# Patient Record
Sex: Male | Born: 1945
Health system: Southern US, Community
[De-identification: ages and names within clinical notes are randomized; demographics above are authoritative.]

## PROBLEM LIST (undated history)

## (undated) DIAGNOSIS — N401 Enlarged prostate with lower urinary tract symptoms: Secondary | ICD-10-CM

## (undated) DIAGNOSIS — F329 Major depressive disorder, single episode, unspecified: Secondary | ICD-10-CM

## (undated) DIAGNOSIS — E7800 Pure hypercholesterolemia, unspecified: Secondary | ICD-10-CM

## (undated) DIAGNOSIS — E1169 Type 2 diabetes mellitus with other specified complication: Secondary | ICD-10-CM

## (undated) HISTORY — DX: Type 2 diabetes mellitus with other specified complication: E78.00

## (undated) HISTORY — PX: CHOLECYSTECTOMY: SHX55

## (undated) HISTORY — DX: Type 2 diabetes mellitus with other specified complication: E11.69

## (undated) HISTORY — DX: Major depressive disorder, single episode, unspecified: F32.9

## (undated) HISTORY — DX: Benign prostatic hyperplasia with lower urinary tract symptoms: N40.1

---

## 1979-07-04 HISTORY — PX: POLYPECTOMY: SHX149

## 2014-10-07 DIAGNOSIS — Z125 Encounter for screening for malignant neoplasm of prostate: Secondary | ICD-10-CM | POA: Diagnosis not present

## 2014-10-07 DIAGNOSIS — Z79899 Other long term (current) drug therapy: Secondary | ICD-10-CM | POA: Diagnosis not present

## 2014-10-07 DIAGNOSIS — F329 Major depressive disorder, single episode, unspecified: Secondary | ICD-10-CM | POA: Diagnosis not present

## 2014-10-07 DIAGNOSIS — N401 Enlarged prostate with lower urinary tract symptoms: Secondary | ICD-10-CM | POA: Diagnosis not present

## 2014-10-07 DIAGNOSIS — R030 Elevated blood-pressure reading, without diagnosis of hypertension: Secondary | ICD-10-CM | POA: Diagnosis not present

## 2014-10-07 DIAGNOSIS — B353 Tinea pedis: Secondary | ICD-10-CM | POA: Diagnosis not present

## 2014-11-11 DIAGNOSIS — H269 Unspecified cataract: Secondary | ICD-10-CM | POA: Diagnosis not present

## 2014-11-11 DIAGNOSIS — N401 Enlarged prostate with lower urinary tract symptoms: Secondary | ICD-10-CM | POA: Diagnosis not present

## 2014-11-11 DIAGNOSIS — F4320 Adjustment disorder, unspecified: Secondary | ICD-10-CM | POA: Diagnosis not present

## 2014-11-11 DIAGNOSIS — E780 Pure hypercholesterolemia: Secondary | ICD-10-CM | POA: Diagnosis not present

## 2015-01-21 DIAGNOSIS — H43393 Other vitreous opacities, bilateral: Secondary | ICD-10-CM | POA: Diagnosis not present

## 2015-01-21 DIAGNOSIS — H2513 Age-related nuclear cataract, bilateral: Secondary | ICD-10-CM | POA: Diagnosis not present

## 2015-09-02 DIAGNOSIS — B353 Tinea pedis: Secondary | ICD-10-CM | POA: Diagnosis not present

## 2015-09-02 DIAGNOSIS — E785 Hyperlipidemia, unspecified: Secondary | ICD-10-CM | POA: Diagnosis not present

## 2015-09-02 DIAGNOSIS — N401 Enlarged prostate with lower urinary tract symptoms: Secondary | ICD-10-CM | POA: Diagnosis not present

## 2015-09-02 DIAGNOSIS — R7301 Impaired fasting glucose: Secondary | ICD-10-CM | POA: Diagnosis not present

## 2015-09-02 DIAGNOSIS — Z0000 Encounter for general adult medical examination without abnormal findings: Secondary | ICD-10-CM | POA: Diagnosis not present

## 2015-09-02 DIAGNOSIS — Z79899 Other long term (current) drug therapy: Secondary | ICD-10-CM | POA: Diagnosis not present

## 2015-09-02 DIAGNOSIS — F329 Major depressive disorder, single episode, unspecified: Secondary | ICD-10-CM | POA: Diagnosis not present

## 2015-12-30 DIAGNOSIS — M2011 Hallux valgus (acquired), right foot: Secondary | ICD-10-CM | POA: Diagnosis not present

## 2015-12-30 DIAGNOSIS — B353 Tinea pedis: Secondary | ICD-10-CM | POA: Diagnosis not present

## 2015-12-30 DIAGNOSIS — M21611 Bunion of right foot: Secondary | ICD-10-CM | POA: Diagnosis not present

## 2016-01-10 DIAGNOSIS — M2011 Hallux valgus (acquired), right foot: Secondary | ICD-10-CM | POA: Diagnosis not present

## 2016-09-04 DIAGNOSIS — E785 Hyperlipidemia, unspecified: Secondary | ICD-10-CM | POA: Diagnosis not present

## 2016-09-04 DIAGNOSIS — Z79899 Other long term (current) drug therapy: Secondary | ICD-10-CM | POA: Diagnosis not present

## 2016-09-04 DIAGNOSIS — Z125 Encounter for screening for malignant neoplasm of prostate: Secondary | ICD-10-CM | POA: Diagnosis not present

## 2016-09-04 DIAGNOSIS — Z131 Encounter for screening for diabetes mellitus: Secondary | ICD-10-CM | POA: Diagnosis not present

## 2016-09-14 DIAGNOSIS — Z0000 Encounter for general adult medical examination without abnormal findings: Secondary | ICD-10-CM | POA: Diagnosis not present

## 2016-09-14 DIAGNOSIS — Z9181 History of falling: Secondary | ICD-10-CM | POA: Diagnosis not present

## 2016-09-14 DIAGNOSIS — E785 Hyperlipidemia, unspecified: Secondary | ICD-10-CM | POA: Diagnosis not present

## 2016-09-14 DIAGNOSIS — Z1389 Encounter for screening for other disorder: Secondary | ICD-10-CM | POA: Diagnosis not present

## 2016-09-14 DIAGNOSIS — F329 Major depressive disorder, single episode, unspecified: Secondary | ICD-10-CM | POA: Diagnosis not present

## 2016-09-14 DIAGNOSIS — E119 Type 2 diabetes mellitus without complications: Secondary | ICD-10-CM | POA: Diagnosis not present

## 2016-09-14 DIAGNOSIS — Z6832 Body mass index (BMI) 32.0-32.9, adult: Secondary | ICD-10-CM | POA: Diagnosis not present

## 2016-09-14 DIAGNOSIS — N401 Enlarged prostate with lower urinary tract symptoms: Secondary | ICD-10-CM | POA: Diagnosis not present

## 2017-01-01 DIAGNOSIS — H269 Unspecified cataract: Secondary | ICD-10-CM | POA: Diagnosis not present

## 2017-01-01 DIAGNOSIS — Z6832 Body mass index (BMI) 32.0-32.9, adult: Secondary | ICD-10-CM | POA: Diagnosis not present

## 2017-01-01 DIAGNOSIS — E119 Type 2 diabetes mellitus without complications: Secondary | ICD-10-CM | POA: Diagnosis not present

## 2017-01-01 DIAGNOSIS — B353 Tinea pedis: Secondary | ICD-10-CM | POA: Diagnosis not present

## 2017-02-19 DIAGNOSIS — D1801 Hemangioma of skin and subcutaneous tissue: Secondary | ICD-10-CM | POA: Diagnosis not present

## 2017-02-19 DIAGNOSIS — L820 Inflamed seborrheic keratosis: Secondary | ICD-10-CM | POA: Diagnosis not present

## 2017-02-27 DIAGNOSIS — H25813 Combined forms of age-related cataract, bilateral: Secondary | ICD-10-CM | POA: Diagnosis not present

## 2017-03-22 DIAGNOSIS — H43813 Vitreous degeneration, bilateral: Secondary | ICD-10-CM | POA: Diagnosis not present

## 2017-03-22 DIAGNOSIS — E113293 Type 2 diabetes mellitus with mild nonproliferative diabetic retinopathy without macular edema, bilateral: Secondary | ICD-10-CM | POA: Diagnosis not present

## 2017-04-16 DIAGNOSIS — Z01818 Encounter for other preprocedural examination: Secondary | ICD-10-CM | POA: Diagnosis not present

## 2017-04-16 DIAGNOSIS — H25811 Combined forms of age-related cataract, right eye: Secondary | ICD-10-CM | POA: Diagnosis not present

## 2017-04-16 DIAGNOSIS — H2511 Age-related nuclear cataract, right eye: Secondary | ICD-10-CM | POA: Diagnosis not present

## 2017-04-16 DIAGNOSIS — E119 Type 2 diabetes mellitus without complications: Secondary | ICD-10-CM | POA: Diagnosis not present

## 2017-04-25 DIAGNOSIS — H25812 Combined forms of age-related cataract, left eye: Secondary | ICD-10-CM | POA: Diagnosis not present

## 2017-04-25 DIAGNOSIS — H2512 Age-related nuclear cataract, left eye: Secondary | ICD-10-CM | POA: Diagnosis not present

## 2017-09-17 DIAGNOSIS — Z6833 Body mass index (BMI) 33.0-33.9, adult: Secondary | ICD-10-CM | POA: Diagnosis not present

## 2017-09-17 DIAGNOSIS — Z0000 Encounter for general adult medical examination without abnormal findings: Secondary | ICD-10-CM | POA: Diagnosis not present

## 2017-09-17 DIAGNOSIS — N401 Enlarged prostate with lower urinary tract symptoms: Secondary | ICD-10-CM | POA: Diagnosis not present

## 2017-09-17 DIAGNOSIS — E119 Type 2 diabetes mellitus without complications: Secondary | ICD-10-CM | POA: Diagnosis not present

## 2017-09-17 DIAGNOSIS — E785 Hyperlipidemia, unspecified: Secondary | ICD-10-CM | POA: Diagnosis not present

## 2017-09-17 DIAGNOSIS — Z79899 Other long term (current) drug therapy: Secondary | ICD-10-CM | POA: Diagnosis not present

## 2017-10-22 DIAGNOSIS — Z961 Presence of intraocular lens: Secondary | ICD-10-CM | POA: Diagnosis not present

## 2017-10-22 DIAGNOSIS — H43813 Vitreous degeneration, bilateral: Secondary | ICD-10-CM | POA: Diagnosis not present

## 2018-12-18 DIAGNOSIS — E785 Hyperlipidemia, unspecified: Secondary | ICD-10-CM | POA: Diagnosis not present

## 2018-12-18 DIAGNOSIS — Z79899 Other long term (current) drug therapy: Secondary | ICD-10-CM | POA: Diagnosis not present

## 2018-12-18 DIAGNOSIS — Z6833 Body mass index (BMI) 33.0-33.9, adult: Secondary | ICD-10-CM | POA: Diagnosis not present

## 2018-12-18 DIAGNOSIS — E7800 Pure hypercholesterolemia, unspecified: Secondary | ICD-10-CM | POA: Diagnosis not present

## 2018-12-18 DIAGNOSIS — N401 Enlarged prostate with lower urinary tract symptoms: Secondary | ICD-10-CM | POA: Diagnosis not present

## 2018-12-18 DIAGNOSIS — Z0000 Encounter for general adult medical examination without abnormal findings: Secondary | ICD-10-CM | POA: Diagnosis not present

## 2018-12-18 DIAGNOSIS — E119 Type 2 diabetes mellitus without complications: Secondary | ICD-10-CM | POA: Diagnosis not present

## 2019-01-06 DIAGNOSIS — Z6833 Body mass index (BMI) 33.0-33.9, adult: Secondary | ICD-10-CM | POA: Diagnosis not present

## 2019-01-06 DIAGNOSIS — E1165 Type 2 diabetes mellitus with hyperglycemia: Secondary | ICD-10-CM | POA: Diagnosis not present

## 2019-03-19 DIAGNOSIS — E1165 Type 2 diabetes mellitus with hyperglycemia: Secondary | ICD-10-CM | POA: Diagnosis not present

## 2019-03-20 DIAGNOSIS — E7800 Pure hypercholesterolemia, unspecified: Secondary | ICD-10-CM | POA: Diagnosis not present

## 2019-03-20 DIAGNOSIS — E1165 Type 2 diabetes mellitus with hyperglycemia: Secondary | ICD-10-CM | POA: Diagnosis not present

## 2019-03-20 DIAGNOSIS — Z6833 Body mass index (BMI) 33.0-33.9, adult: Secondary | ICD-10-CM | POA: Diagnosis not present

## 2019-07-07 DIAGNOSIS — H524 Presbyopia: Secondary | ICD-10-CM | POA: Diagnosis not present

## 2019-07-07 DIAGNOSIS — H52223 Regular astigmatism, bilateral: Secondary | ICD-10-CM | POA: Diagnosis not present

## 2019-09-11 DIAGNOSIS — E1165 Type 2 diabetes mellitus with hyperglycemia: Secondary | ICD-10-CM | POA: Diagnosis not present

## 2019-09-11 DIAGNOSIS — E7800 Pure hypercholesterolemia, unspecified: Secondary | ICD-10-CM | POA: Diagnosis not present

## 2019-09-17 DIAGNOSIS — E7800 Pure hypercholesterolemia, unspecified: Secondary | ICD-10-CM | POA: Diagnosis not present

## 2019-09-17 DIAGNOSIS — Z6830 Body mass index (BMI) 30.0-30.9, adult: Secondary | ICD-10-CM | POA: Diagnosis not present

## 2019-09-17 DIAGNOSIS — N401 Enlarged prostate with lower urinary tract symptoms: Secondary | ICD-10-CM | POA: Diagnosis not present

## 2019-09-17 DIAGNOSIS — Z9181 History of falling: Secondary | ICD-10-CM | POA: Diagnosis not present

## 2019-09-17 DIAGNOSIS — E1165 Type 2 diabetes mellitus with hyperglycemia: Secondary | ICD-10-CM | POA: Diagnosis not present

## 2019-12-10 DIAGNOSIS — Z961 Presence of intraocular lens: Secondary | ICD-10-CM | POA: Diagnosis not present

## 2019-12-10 DIAGNOSIS — H43393 Other vitreous opacities, bilateral: Secondary | ICD-10-CM | POA: Diagnosis not present

## 2019-12-10 DIAGNOSIS — H26493 Other secondary cataract, bilateral: Secondary | ICD-10-CM | POA: Diagnosis not present

## 2020-01-09 DIAGNOSIS — H26491 Other secondary cataract, right eye: Secondary | ICD-10-CM | POA: Diagnosis not present

## 2020-03-23 DIAGNOSIS — E1165 Type 2 diabetes mellitus with hyperglycemia: Secondary | ICD-10-CM | POA: Diagnosis not present

## 2020-03-24 DIAGNOSIS — N401 Enlarged prostate with lower urinary tract symptoms: Secondary | ICD-10-CM | POA: Diagnosis not present

## 2020-03-24 DIAGNOSIS — E1165 Type 2 diabetes mellitus with hyperglycemia: Secondary | ICD-10-CM | POA: Diagnosis not present

## 2020-03-24 DIAGNOSIS — E7800 Pure hypercholesterolemia, unspecified: Secondary | ICD-10-CM | POA: Diagnosis not present

## 2020-03-24 DIAGNOSIS — Z0000 Encounter for general adult medical examination without abnormal findings: Secondary | ICD-10-CM | POA: Diagnosis not present

## 2020-03-24 DIAGNOSIS — Z0183 Encounter for blood typing: Secondary | ICD-10-CM | POA: Diagnosis not present

## 2020-03-24 DIAGNOSIS — Z6830 Body mass index (BMI) 30.0-30.9, adult: Secondary | ICD-10-CM | POA: Diagnosis not present

## 2020-08-02 DIAGNOSIS — J329 Chronic sinusitis, unspecified: Secondary | ICD-10-CM | POA: Diagnosis not present

## 2020-08-02 DIAGNOSIS — J40 Bronchitis, not specified as acute or chronic: Secondary | ICD-10-CM | POA: Diagnosis not present

## 2020-08-02 DIAGNOSIS — J302 Other seasonal allergic rhinitis: Secondary | ICD-10-CM | POA: Diagnosis not present

## 2020-11-25 DIAGNOSIS — E1169 Type 2 diabetes mellitus with other specified complication: Secondary | ICD-10-CM | POA: Diagnosis not present

## 2020-11-25 DIAGNOSIS — E7800 Pure hypercholesterolemia, unspecified: Secondary | ICD-10-CM | POA: Diagnosis not present

## 2020-11-26 DIAGNOSIS — E1169 Type 2 diabetes mellitus with other specified complication: Secondary | ICD-10-CM | POA: Diagnosis not present

## 2020-11-26 DIAGNOSIS — N401 Enlarged prostate with lower urinary tract symptoms: Secondary | ICD-10-CM | POA: Diagnosis not present

## 2020-11-26 DIAGNOSIS — E7800 Pure hypercholesterolemia, unspecified: Secondary | ICD-10-CM | POA: Diagnosis not present

## 2020-11-26 DIAGNOSIS — Z6829 Body mass index (BMI) 29.0-29.9, adult: Secondary | ICD-10-CM | POA: Diagnosis not present

## 2020-11-26 DIAGNOSIS — M21611 Bunion of right foot: Secondary | ICD-10-CM | POA: Diagnosis not present

## 2021-01-20 DIAGNOSIS — E1169 Type 2 diabetes mellitus with other specified complication: Secondary | ICD-10-CM | POA: Diagnosis not present

## 2021-01-20 DIAGNOSIS — E7800 Pure hypercholesterolemia, unspecified: Secondary | ICD-10-CM | POA: Diagnosis not present

## 2021-01-20 DIAGNOSIS — Z79899 Other long term (current) drug therapy: Secondary | ICD-10-CM | POA: Diagnosis not present

## 2021-01-21 DIAGNOSIS — E7800 Pure hypercholesterolemia, unspecified: Secondary | ICD-10-CM | POA: Diagnosis not present

## 2021-01-21 DIAGNOSIS — R17 Unspecified jaundice: Secondary | ICD-10-CM | POA: Diagnosis not present

## 2021-01-21 DIAGNOSIS — R1013 Epigastric pain: Secondary | ICD-10-CM | POA: Diagnosis not present

## 2021-01-21 DIAGNOSIS — K8070 Calculus of gallbladder and bile duct without cholecystitis without obstruction: Secondary | ICD-10-CM | POA: Diagnosis not present

## 2021-01-21 DIAGNOSIS — E1169 Type 2 diabetes mellitus with other specified complication: Secondary | ICD-10-CM | POA: Diagnosis not present

## 2021-01-21 DIAGNOSIS — Z6828 Body mass index (BMI) 28.0-28.9, adult: Secondary | ICD-10-CM | POA: Diagnosis not present

## 2021-01-21 DIAGNOSIS — K831 Obstruction of bile duct: Secondary | ICD-10-CM | POA: Diagnosis not present

## 2021-01-21 DIAGNOSIS — K769 Liver disease, unspecified: Secondary | ICD-10-CM | POA: Diagnosis not present

## 2021-01-21 DIAGNOSIS — K838 Other specified diseases of biliary tract: Secondary | ICD-10-CM | POA: Diagnosis not present

## 2021-01-21 DIAGNOSIS — K5730 Diverticulosis of large intestine without perforation or abscess without bleeding: Secondary | ICD-10-CM | POA: Diagnosis not present

## 2021-01-21 DIAGNOSIS — K828 Other specified diseases of gallbladder: Secondary | ICD-10-CM | POA: Diagnosis not present

## 2021-01-24 DIAGNOSIS — K8020 Calculus of gallbladder without cholecystitis without obstruction: Secondary | ICD-10-CM | POA: Diagnosis not present

## 2021-01-24 DIAGNOSIS — R17 Unspecified jaundice: Secondary | ICD-10-CM | POA: Diagnosis not present

## 2021-01-25 DIAGNOSIS — D509 Iron deficiency anemia, unspecified: Secondary | ICD-10-CM | POA: Diagnosis not present

## 2021-01-25 DIAGNOSIS — N400 Enlarged prostate without lower urinary tract symptoms: Secondary | ICD-10-CM | POA: Diagnosis not present

## 2021-01-25 DIAGNOSIS — E119 Type 2 diabetes mellitus without complications: Secondary | ICD-10-CM | POA: Diagnosis not present

## 2021-01-25 DIAGNOSIS — K660 Peritoneal adhesions (postprocedural) (postinfection): Secondary | ICD-10-CM | POA: Diagnosis not present

## 2021-01-25 DIAGNOSIS — K831 Obstruction of bile duct: Secondary | ICD-10-CM | POA: Diagnosis not present

## 2021-01-25 DIAGNOSIS — I10 Essential (primary) hypertension: Secondary | ICD-10-CM | POA: Diagnosis not present

## 2021-01-25 DIAGNOSIS — Z87898 Personal history of other specified conditions: Secondary | ICD-10-CM | POA: Diagnosis not present

## 2021-01-25 DIAGNOSIS — K811 Chronic cholecystitis: Secondary | ICD-10-CM | POA: Diagnosis not present

## 2021-01-25 DIAGNOSIS — K8066 Calculus of gallbladder and bile duct with acute and chronic cholecystitis without obstruction: Secondary | ICD-10-CM | POA: Diagnosis not present

## 2021-01-25 DIAGNOSIS — Z9049 Acquired absence of other specified parts of digestive tract: Secondary | ICD-10-CM | POA: Diagnosis not present

## 2021-01-25 DIAGNOSIS — K812 Acute cholecystitis with chronic cholecystitis: Secondary | ICD-10-CM | POA: Diagnosis not present

## 2021-01-25 DIAGNOSIS — Z8679 Personal history of other diseases of the circulatory system: Secondary | ICD-10-CM | POA: Diagnosis not present

## 2021-01-25 DIAGNOSIS — K8067 Calculus of gallbladder and bile duct with acute and chronic cholecystitis with obstruction: Secondary | ICD-10-CM | POA: Diagnosis not present

## 2021-01-25 DIAGNOSIS — R17 Unspecified jaundice: Secondary | ICD-10-CM | POA: Diagnosis not present

## 2021-01-25 DIAGNOSIS — K8020 Calculus of gallbladder without cholecystitis without obstruction: Secondary | ICD-10-CM | POA: Diagnosis not present

## 2021-01-25 DIAGNOSIS — K828 Other specified diseases of gallbladder: Secondary | ICD-10-CM | POA: Diagnosis not present

## 2021-01-25 DIAGNOSIS — K8050 Calculus of bile duct without cholangitis or cholecystitis without obstruction: Secondary | ICD-10-CM | POA: Diagnosis not present

## 2021-02-02 DIAGNOSIS — K831 Obstruction of bile duct: Secondary | ICD-10-CM | POA: Diagnosis not present

## 2021-02-03 DIAGNOSIS — K8051 Calculus of bile duct without cholangitis or cholecystitis with obstruction: Secondary | ICD-10-CM | POA: Diagnosis not present

## 2021-02-03 DIAGNOSIS — Z6827 Body mass index (BMI) 27.0-27.9, adult: Secondary | ICD-10-CM | POA: Diagnosis not present

## 2021-02-09 DIAGNOSIS — K8010 Calculus of gallbladder with chronic cholecystitis without obstruction: Secondary | ICD-10-CM

## 2021-02-09 DIAGNOSIS — Z09 Encounter for follow-up examination after completed treatment for conditions other than malignant neoplasm: Secondary | ICD-10-CM

## 2021-02-09 HISTORY — DX: Calculus of gallbladder with chronic cholecystitis without obstruction: K80.10

## 2021-02-09 HISTORY — DX: Encounter for follow-up examination after completed treatment for conditions other than malignant neoplasm: Z09

## 2021-03-23 DIAGNOSIS — K8050 Calculus of bile duct without cholangitis or cholecystitis without obstruction: Secondary | ICD-10-CM | POA: Diagnosis not present

## 2021-04-05 DIAGNOSIS — E119 Type 2 diabetes mellitus without complications: Secondary | ICD-10-CM | POA: Diagnosis not present

## 2021-04-05 DIAGNOSIS — K8050 Calculus of bile duct without cholangitis or cholecystitis without obstruction: Secondary | ICD-10-CM | POA: Diagnosis not present

## 2021-04-05 DIAGNOSIS — K5710 Diverticulosis of small intestine without perforation or abscess without bleeding: Secondary | ICD-10-CM | POA: Diagnosis not present

## 2021-04-05 DIAGNOSIS — I10 Essential (primary) hypertension: Secondary | ICD-10-CM | POA: Diagnosis not present

## 2021-04-05 DIAGNOSIS — Z4659 Encounter for fitting and adjustment of other gastrointestinal appliance and device: Secondary | ICD-10-CM | POA: Diagnosis not present

## 2021-04-05 DIAGNOSIS — Z9889 Other specified postprocedural states: Secondary | ICD-10-CM | POA: Diagnosis not present

## 2021-05-02 DIAGNOSIS — I10 Essential (primary) hypertension: Secondary | ICD-10-CM | POA: Diagnosis not present

## 2021-05-02 DIAGNOSIS — E785 Hyperlipidemia, unspecified: Secondary | ICD-10-CM | POA: Diagnosis not present

## 2021-05-10 DIAGNOSIS — R945 Abnormal results of liver function studies: Secondary | ICD-10-CM | POA: Diagnosis not present

## 2021-05-18 DIAGNOSIS — R011 Cardiac murmur, unspecified: Secondary | ICD-10-CM | POA: Diagnosis not present

## 2021-05-18 DIAGNOSIS — Z6828 Body mass index (BMI) 28.0-28.9, adult: Secondary | ICD-10-CM | POA: Diagnosis not present

## 2021-05-18 DIAGNOSIS — Z0000 Encounter for general adult medical examination without abnormal findings: Secondary | ICD-10-CM | POA: Diagnosis not present

## 2021-05-18 DIAGNOSIS — I10 Essential (primary) hypertension: Secondary | ICD-10-CM | POA: Diagnosis not present

## 2021-05-31 DIAGNOSIS — I340 Nonrheumatic mitral (valve) insufficiency: Secondary | ICD-10-CM | POA: Diagnosis not present

## 2021-05-31 DIAGNOSIS — I352 Nonrheumatic aortic (valve) stenosis with insufficiency: Secondary | ICD-10-CM | POA: Diagnosis not present

## 2021-05-31 DIAGNOSIS — I10 Essential (primary) hypertension: Secondary | ICD-10-CM | POA: Diagnosis not present

## 2021-06-02 ENCOUNTER — Encounter: Payer: Self-pay | Admitting: Cardiology

## 2021-06-02 ENCOUNTER — Encounter: Payer: Self-pay | Admitting: *Deleted

## 2021-06-13 DIAGNOSIS — I350 Nonrheumatic aortic (valve) stenosis: Secondary | ICD-10-CM | POA: Diagnosis not present

## 2021-06-13 DIAGNOSIS — I351 Nonrheumatic aortic (valve) insufficiency: Secondary | ICD-10-CM | POA: Diagnosis not present

## 2021-06-13 DIAGNOSIS — E785 Hyperlipidemia, unspecified: Secondary | ICD-10-CM | POA: Diagnosis not present

## 2021-06-13 DIAGNOSIS — I10 Essential (primary) hypertension: Secondary | ICD-10-CM | POA: Diagnosis not present

## 2021-06-17 DIAGNOSIS — E7800 Pure hypercholesterolemia, unspecified: Secondary | ICD-10-CM | POA: Diagnosis not present

## 2021-06-17 DIAGNOSIS — I351 Nonrheumatic aortic (valve) insufficiency: Secondary | ICD-10-CM | POA: Diagnosis not present

## 2021-06-17 DIAGNOSIS — Z6829 Body mass index (BMI) 29.0-29.9, adult: Secondary | ICD-10-CM | POA: Diagnosis not present

## 2021-06-17 DIAGNOSIS — I10 Essential (primary) hypertension: Secondary | ICD-10-CM | POA: Diagnosis not present

## 2021-06-17 DIAGNOSIS — E1169 Type 2 diabetes mellitus with other specified complication: Secondary | ICD-10-CM | POA: Diagnosis not present

## 2021-06-29 DIAGNOSIS — H43393 Other vitreous opacities, bilateral: Secondary | ICD-10-CM | POA: Diagnosis not present

## 2021-07-19 DIAGNOSIS — E1169 Type 2 diabetes mellitus with other specified complication: Secondary | ICD-10-CM | POA: Insufficient documentation

## 2021-07-19 DIAGNOSIS — E7800 Pure hypercholesterolemia, unspecified: Secondary | ICD-10-CM | POA: Insufficient documentation

## 2021-07-19 DIAGNOSIS — F329 Major depressive disorder, single episode, unspecified: Secondary | ICD-10-CM | POA: Insufficient documentation

## 2021-07-19 DIAGNOSIS — N401 Enlarged prostate with lower urinary tract symptoms: Secondary | ICD-10-CM | POA: Insufficient documentation

## 2021-07-20 NOTE — Progress Notes (Signed)
Cardiology Office Note:    Date:  07/21/2021   ID:  Patrick Mcdaniel, DOB 11/09/1945, MRN [id number]  PCP:  Mateo Flow, MD  Cardiologist:  Shirlee More, MD   Referring MD: Mateo Flow, MD  ASSESSMENT:    1. Nonrheumatic aortic valve stenosis   2. Nonrheumatic aortic valve insufficiency   3. Type 2 diabetes mellitus with hypercholesterolemia (HCC)   4. Elevated cholesterol   5. Primary hypertension    PLAN:    In order of problems listed above:  Mr. Patrick Mcdaniel has mixed aortic valve disease and by exam I suspect he has more than mild stenosis.  His left ventricle was dilated he is having anginal symptoms as well as episodes of weakness.  I went back and looked at the report from [facility] and the study was technically limited.  I think he needs further evaluation may require valvular intervention.  The first I do is repeat the echocardiogram in office and define if he has at least moderate stenosis and regurgitation if that is the case I think he should undergo coronary angiography right left heart catheterization for mixed AAS AR with a dilated appendix..  We can also define whether his EKG today is telling us that he has had an interim cardiac injury.  He has no evidence of heart failure. Stable diabetes not on oral agents Continue statin I will continue his ACE inhibitor I would not intensify antihypertensive therapy pending his echo that will be done next Wednesday  Next appointment appointment in 4 weeks but I think you need to be seen and have further evaluation before that   Medication Adjustments/Labs and Tests Ordered: Current medicines are reviewed at length with the patient today.  Concerns regarding medicines are outlined above.  Orders Placed This Encounter  Procedures   EKG 12-Lead   No orders of the defined types were placed in this encounter.    Chief Complaint  Patient presents with   Aortic Insuffiency  I am concerned that I have more severe valvular  heart disease and wanted to get a second opinion from you  History of Present Illness:    Patrick Mcdaniel is a 76 y.o. male with a history of hypertension hyperlipidemia type 2 diabetes and heart murmur who is being seen today for the evaluation of aortic regurgitation at the request of Mateo Flow, MD.  An echocardiogram performed at [facility] 05/31/2021 interpreted by me.  The left ventricle is mildly dilated there is mild concentric LVH systolic function was normal EF 55 to 60%.  Aortic valve was calcified restricted there is moderate aortic regurgitation mild aortic stenosis.  Peak and mean aortic gradients 30 and 16 mmHg.  He was seen in consultation [facility] in [place] office 06/02/2021 for his aortic valve disease.  There was no recommendation for intervention for his valvular heart disease.  He wanted to see me today for another opinion. He is concerned because he is symptomatic having chest pressure substernal into the left precordium without activity last for a few minutes and resolves.  He also has had episodes where he feels profoundly weak and has to sit and rest to recover but is not having exertional angina edema orthopnea palpitation or syncope.  First comment heart murmur about 3 years ago noted on a preop exam 2 years  This summer he had common bile duct stone ERCP stent common bile duct and subsequent cholecystectomy.  Prior to that he was having severe  abdominal and substernal chest pain it resolved  Recently started having recurrent chest pain.  He has no history of congenital rheumatic heart disease. Past Medical History:  Diagnosis Date   Benign non-nodular prostatic hyperplasia with lower urinary tract symptoms    Calculus of gallbladder with chronic cholecystitis without obstruction 02/09/2021   Elevated cholesterol    Major depressive disorder with single episode, remission status unspecified    Type 2 diabetes mellitus with hypercholesterolemia  ([place])     Past Surgical History:  Procedure Laterality Date   CHOLECYSTECTOMY     POLYPECTOMY  1981   Urethral polyps by Dr. Judeen Hammans    Current Medications: Current Meds  Medication Sig   atorvastatin (LIPITOR) 10 MG tablet Take 10 mg by mouth daily.   lisinopril (ZESTRIL) 10 MG tablet Take 1 tablet by mouth daily.   tamsulosin (FLOMAX) 0.4 MG CAPS capsule Take 0.4 mg by mouth daily.     Allergies:   Patient has no known allergies.   Social History   Socioeconomic History   Marital status: Married    Spouse name: Not on file   Number of children: Not on file   Years of education: Not on file   Highest education level: Not on file  Occupational History   Not on file  Tobacco Use   Smoking status: Never    Passive exposure: Never   Smokeless tobacco: Never  Vaping Use   Vaping Use: Never used  Substance and Sexual Activity   Alcohol use: Yes    Alcohol/week: 7.0 - 14.0 standard drinks    Types: 7 - 14 Cans of beer per week   Drug use: Never   Sexual activity: Not on file  Other Topics Concern   Not on file  Social History Narrative   Not on file   Social Determinants of Health   Financial Resource Strain: Not on file  Food Insecurity: Not on file  Transportation Needs: Not on file  Physical Activity: Not on file  Stress: Not on file  Social Connections: Not on file     Family History: The patient's family history includes COPD in his father; Heart attack in his father; Hypercholesterolemia in his father; Hypertension in his father and mother.  ROS:   ROS Please see the history of present illness.     All other systems reviewed and are negative.  EKGs/Labs/Other Studies Reviewed:    The following studies were reviewed today:   EKG:  EKG is  ordered today.  The ekg ordered today is personally reviewed and demonstrates sinus rhythm abnormal EKG consider previous inferior infarction consider anterior lateral infarction. EKG 06/13/2021 described as  normal  Recent Labs: 06/17/2021 cholesterol 144 HDL 42 triglycerides 82 A1c 6.3%  Physical Exam:    VS:  BP 133/69 (BP Location: Right Arm, Patient Position: Standing)    Pulse (!) 101    Ht 6\' 1"  (1.854 m)    Wt 233 lb 6.4 oz (105.9 kg)    SpO2 98%    BMI 30.79 kg/m     Wt Readings from Last 3 Encounters:  07/21/21 233 lb 6.4 oz (105.9 kg)  05/18/21 225 lb (102.1 kg)    His physical examination shows a blood pressure 182/66 with a pulse pressure greater then 100 He does not have pistol shots in the brachial artery Bounding carotid upstroke Grade 2/6 to 3/6 aortic outflow murmur radiates to his carotids 1 of 6 AR left lower sternal border  GEN:  Well nourished, well developed in no acute distress HEENT: Normal NECK: No JVD; No carotid bruits LYMPHATICS: No lymphadenopathy CARDIAC: RRR, no murmurs, rubs, gallops RESPIRATORY:  Clear to auscultation without rales, wheezing or rhonchi  ABDOMEN: Soft, non-tender, non-distended MUSCULOSKELETAL:  No edema; No deformity  SKIN: Warm and dry NEUROLOGIC:  Alert and oriented x 3 PSYCHIATRIC:  Normal affect     Signed, Shirlee More, MD  07/21/2021 4:03 PM    [facility]

## 2021-07-21 ENCOUNTER — Ambulatory Visit: Payer: Medicare HMO | Admitting: Cardiology

## 2021-07-21 ENCOUNTER — Other Ambulatory Visit: Payer: Self-pay

## 2021-07-21 ENCOUNTER — Encounter: Payer: Self-pay | Admitting: Cardiology

## 2021-07-21 VITALS — BP 133/69 | HR 101 | Ht 73.0 in | Wt 233.4 lb

## 2021-07-21 DIAGNOSIS — I10 Essential (primary) hypertension: Secondary | ICD-10-CM

## 2021-07-21 DIAGNOSIS — I351 Nonrheumatic aortic (valve) insufficiency: Secondary | ICD-10-CM | POA: Diagnosis not present

## 2021-07-21 DIAGNOSIS — I350 Nonrheumatic aortic (valve) stenosis: Secondary | ICD-10-CM | POA: Diagnosis not present

## 2021-07-21 DIAGNOSIS — E1169 Type 2 diabetes mellitus with other specified complication: Secondary | ICD-10-CM | POA: Diagnosis not present

## 2021-07-21 DIAGNOSIS — E7800 Pure hypercholesterolemia, unspecified: Secondary | ICD-10-CM | POA: Diagnosis not present

## 2021-07-21 NOTE — Patient Instructions (Signed)
Medication Instructions:  Your physician recommends that you continue on your current medications as directed. Please refer to the Current Medication list given to you today.  *If you need a refill on your cardiac medications before your next appointment, please call your pharmacy*   Lab Work: None If you have labs (blood work) drawn today and your tests are completely normal, you will receive your results only by: [place] (if you have MyChart) OR A paper copy in the mail If you have any lab test that is abnormal or we need to change your treatment, we will call you to review the results.   Testing/Procedures: Your physician has requested that you have an echocardiogram. Echocardiography is a painless test that uses sound waves to create images of your heart. It provides your doctor with information about the size and shape of your heart and how well your hearts chambers and valves are working. This procedure takes approximately one hour. There are no restrictions for this procedure.    Follow-Up: At [facility], you and your health needs are our priority.  As part of our continuing mission to provide you with exceptional heart care, we have created designated Provider Care Teams.  These Care Teams include your primary Cardiologist (physician) and Advanced Practice Providers (APPs -  Physician Assistants and Nurse Practitioners) who all work together to provide you with the care you need, when you need it.  We recommend signing up for the patient portal called "MyChart".  Sign up information is provided on this After Visit Summary.  MyChart is used to connect with patients for Virtual Visits (Telemedicine).  Patients are able to view lab/test results, encounter notes, upcoming appointments, etc.  Non-urgent messages can be sent to your provider as well.   To learn more about what you can do with MyChart, go to [email or web address].    Your next appointment:   4 week(s)  The  format for your next appointment:   In Person  Provider:   Shirlee More, MD    Other Instructions

## 2021-07-27 ENCOUNTER — Telehealth: Payer: Self-pay

## 2021-07-27 ENCOUNTER — Other Ambulatory Visit: Payer: Self-pay

## 2021-07-27 ENCOUNTER — Ambulatory Visit (INDEPENDENT_AMBULATORY_CARE_PROVIDER_SITE_OTHER): Payer: Medicare HMO

## 2021-07-27 DIAGNOSIS — I350 Nonrheumatic aortic (valve) stenosis: Secondary | ICD-10-CM | POA: Diagnosis not present

## 2021-07-27 DIAGNOSIS — R072 Precordial pain: Secondary | ICD-10-CM

## 2021-07-27 LAB — ECHOCARDIOGRAM COMPLETE
AR max vel: 1.74 cm2
AV Area VTI: 2.1 cm2
AV Area mean vel: 1.8 cm2
AV Mean grad: 12 mmHg
AV Peak grad: 21.4 mmHg
Ao pk vel: 2.32 m/s
Area-P 1/2: 4.21 cm2
P 1/2 time: 354 msec
S' Lateral: 3.75 cm

## 2021-07-27 MED ORDER — METOPROLOL TARTRATE 100 MG PO TABS: 100.0000 mg | ORAL_TABLET | Freq: Once | ORAL | 0 refills | Status: DC

## 2021-07-27 NOTE — Telephone Encounter (Signed)
Spoke with patient regarding results and recommendation.  Patient verbalizes understanding and is agreeable to plan of care. Advised patient to call back with any issues or concerns.    The following instructions were reviewed with the patient and he verbalizes understanding.     Your cardiac CT will be scheduled at the below location:   [facility] [address] [place], [place] [address] [phone]  If scheduled at [facility], please arrive at the [facility] main entrance (entrance A) of [place] 30 minutes prior to test start time. You can use the FREE valet parking offered at the main entrance (encouraged to control the heart rate for the test) Proceed to the [facility] Radiology Department (first floor) to check-in and test prep.  Please follow these instructions carefully (unless otherwise directed):  On the Night Before the Test: Be sure to Drink plenty of water. Do not consume any caffeinated/decaffeinated beverages or chocolate 12 hours prior to your test. Do not take any antihistamines 12 hours prior to your test.  On the Day of the Test: Drink plenty of water until 1 hour prior to the test. Do not eat any food 4 hours prior to the test. You may take your regular medications prior to the test.  Take metoprolol (Lopressor) two hours prior to test.       After the Test: Drink plenty of water. After receiving IV contrast, you may experience a mild flushed feeling. This is normal. On occasion, you may experience a mild rash up to 24 hours after the test. This is not dangerous. If this occurs, you can take Benadryl 25 mg and increase your fluid intake. If you experience trouble breathing, this can be serious. If it is severe call 911 IMMEDIATELY. If it is mild, please call our office. If you take any of these medications: Glipizide/Metformin, Avandament, Glucavance, please do not take 48 hours after completing test unless otherwise  instructed.  We will call to schedule your test 2-4 weeks out understanding that some insurance companies will need an authorization prior to the service being performed.   For non-scheduling related questions, please contact the cardiac imaging nurse navigator should you have any questions/concerns: Marchia Bond, Cardiac Imaging Nurse Navigator Gordy Clement, Cardiac Imaging Nurse Navigator [facility] Direct Office Dial: [phone]   For scheduling needs, including cancellations and rescheduling, please call [place], [phone].

## 2021-07-27 NOTE — Telephone Encounter (Signed)
-----   Message from Patrick Priest, MD sent at 07/27/2021  4:01 PM EST ----- I reviewed his echocardiogram  His aortic valve is not severe  I want him to have a cardiac CTA performed expedited.  I do not think he needs intervention for his heart valve

## 2021-07-28 DIAGNOSIS — H353131 Nonexudative age-related macular degeneration, bilateral, early dry stage: Secondary | ICD-10-CM | POA: Diagnosis not present

## 2021-07-28 DIAGNOSIS — E119 Type 2 diabetes mellitus without complications: Secondary | ICD-10-CM | POA: Diagnosis not present

## 2021-07-30 ENCOUNTER — Other Ambulatory Visit: Payer: Self-pay | Admitting: Cardiology

## 2021-08-12 ENCOUNTER — Other Ambulatory Visit: Payer: Self-pay

## 2021-08-12 DIAGNOSIS — R072 Precordial pain: Secondary | ICD-10-CM

## 2021-08-16 ENCOUNTER — Ambulatory Visit (HOSPITAL_COMMUNITY): Payer: Medicare HMO

## 2021-08-18 ENCOUNTER — Telehealth (HOSPITAL_COMMUNITY): Payer: Self-pay | Admitting: *Deleted

## 2021-08-18 DIAGNOSIS — R072 Precordial pain: Secondary | ICD-10-CM | POA: Diagnosis not present

## 2021-08-18 NOTE — Telephone Encounter (Signed)
Reaching out to patient to offer assistance regarding upcoming cardiac imaging study; pt verbalizes understanding of appt date/time, parking situation and where to check in, pre-test NPO status and medications ordered, and verified current allergies; name and call back number provided for further questions should they arise  Gordy Clement RN Navigator Cardiac Imaging [facility] [phone] office [phone] cell  Patient to take 100mg  metoprolol tartrate two hours prior to his cardiac CT scan. He is aware to arrive at 2:45pm for his 3:15pm scan.

## 2021-08-19 ENCOUNTER — Telehealth: Payer: Self-pay

## 2021-08-19 ENCOUNTER — Ambulatory Visit: Payer: Medicare HMO | Admitting: Cardiology

## 2021-08-19 LAB — BASIC METABOLIC PANEL
BUN/Creatinine Ratio: 14 (ref 10–24)
BUN: 14 mg/dL (ref 8–27)
CO2: 24 mmol/L (ref 20–29)
Calcium: 9.6 mg/dL (ref 8.6–10.2)
Chloride: 103 mmol/L (ref 96–106)
Creatinine, Ser: 0.99 mg/dL (ref 0.76–1.27)
Glucose: 138 mg/dL — ABNORMAL HIGH (ref 70–99)
Potassium: 4.9 mmol/L (ref 3.5–5.2)
Sodium: 139 mmol/L (ref 134–144)
eGFR: 79 mL/min/{1.73_m2} (ref >59)

## 2021-08-19 NOTE — Telephone Encounter (Signed)
-----   Message from Richardo Priest, MD sent at 08/19/2021 12:08 PM EST ----- Normal or stable result

## 2021-08-19 NOTE — Telephone Encounter (Signed)
Spoke with patient regarding results and recommendation.  Patient verbalizes understanding and is agreeable to plan of care. Advised patient to call back with any issues or concerns.  

## 2021-08-22 ENCOUNTER — Other Ambulatory Visit: Payer: Self-pay

## 2021-08-22 ENCOUNTER — Ambulatory Visit (HOSPITAL_COMMUNITY)
Admission: RE | Admit: 2021-08-22 | Discharge: 2021-08-22 | Disposition: A | Discharge status: Home / Self Care | Payer: Medicare HMO | Source: Ambulatory Visit | Attending: Cardiology | Admitting: Cardiology

## 2021-08-22 ENCOUNTER — Encounter (HOSPITAL_COMMUNITY): Payer: Self-pay

## 2021-08-22 DIAGNOSIS — R072 Precordial pain: Secondary | ICD-10-CM | POA: Diagnosis not present

## 2021-08-22 MED ORDER — NITROGLYCERIN 0.4 MG SL SUBL
SUBLINGUAL_TABLET | SUBLINGUAL | Status: AC
  Filled 2021-08-22: qty 2

## 2021-08-22 MED ORDER — IOHEXOL 350 MG/ML SOLN
95.0000 mL | Freq: Once | INTRAVENOUS | Status: AC | PRN
  Administered 2021-08-22: 95 mL via INTRAVENOUS

## 2021-08-22 MED ORDER — NITROGLYCERIN 0.4 MG SL SUBL
0.8000 mg | SUBLINGUAL_TABLET | Freq: Once | SUBLINGUAL | Status: AC
  Administered 2021-08-22: 0.8 mg via SUBLINGUAL

## 2021-08-23 ENCOUNTER — Telehealth: Payer: Self-pay

## 2021-08-23 DIAGNOSIS — Z01818 Encounter for other preprocedural examination: Secondary | ICD-10-CM | POA: Diagnosis not present

## 2021-08-23 DIAGNOSIS — H43391 Other vitreous opacities, right eye: Secondary | ICD-10-CM | POA: Diagnosis not present

## 2021-08-23 NOTE — Telephone Encounter (Signed)
° °  Pre-operative Risk Assessment    Patient Name: Patrick Mcdaniel  DOB: May 25, 1946 MRN: [id number]      Request for Surgical Clearance    Procedure:   Vitrectomy-Right  Date of Surgery:  Clearance 09/06/21                                 Surgeon:  Dr. Stacie Glaze Surgeon's Group or Practice Name:  [facility] Phone number:  [phone] Fax number:  [phone]   Type of Clearance Requested:   - Medical    Type of Anesthesia:   "IV sedation   Additional requests/questions:  Please advise surgeon/provider what medications should be held.  Signed, Gita Kudo   08/23/2021, 4:49 PM

## 2021-08-23 NOTE — Telephone Encounter (Signed)
Spoke with patient regarding results and recommendation.  Patient verbalizes understanding and is agreeable to plan of care. Advised patient to call back with any issues or concerns.  

## 2021-08-23 NOTE — Telephone Encounter (Signed)
° °  Patient Name: Patrick Mcdaniel  DOB: 1946-04-05 MRN: [id number]  Primary Cardiologist: Dr. Shirlee More  Chart reviewed as part of pre-operative protocol coverage. Patient recently seen by Dr. Bettina Gavia 07/21/21 at which time he was having chest pain concerning for angina as well as episodes of profound weakness. Dr. Bettina Gavia was concerned for progressive valvular disease.   - 2D echo 07/27/21 showed EF 50-55%, grade 1 DD, mild aortic stenosis. - However, coronary CT scan report yesterday noted "Aortic Valve calcium score 2016 suggesting significant aortic stenosis Tri leaflet AV with partially fused left / right cusps." No significant obstructive CAD.  - No recent labs on file aside from BMET.   Given mention that there may be significant AS on coronary CT, though not corroborated on echo, will route to Dr. Bettina Gavia to ensure no further workup planned at this time for patient's symptoms and inquire about clearance for eye surgery. Dr. Bettina Gavia - Please route response to P CV DIV PREOP (the pre-op pool). Thank you.  Charlie Pitter, PA-C 08/23/2021, 5:12 PM

## 2021-08-23 NOTE — Telephone Encounter (Signed)
-----   Message from Richardo Priest, MD sent at 08/23/2021  9:50 AM EST ----- Regarding: FW: Mild coronary atherosclerosis and CAD Would not advise heart cath at this time ----- Message ----- From: Interface, Rad Results In Sent: 08/22/2021   4:31 PM EST To: Richardo Priest, MD

## 2021-08-24 NOTE — Telephone Encounter (Signed)
I s/w the pt and went over the recommendations from Dr. Bettina Gavia that he wants to see the pt on 09/08/21 as planned before he will sign off on clearance. Pt is agreeable to plan of care

## 2021-08-24 NOTE — Telephone Encounter (Signed)
° °  Patient Name: Patrick Mcdaniel  DOB: [birth year] MRN: [id number]  Primary Cardiologist: Dr.Munley  Chart revisited as part of pre-operative protocol coverage. As below, patient was recently seen in the office 07/2021 with accelerating cardiac symptoms and workup was undertaken. Dr. Bettina Gavia recommends to defer surgery until after his office follow-up which is scheduled 09/08/21.  Will route to callback just to let patient know Dr. Bettina Gavia has not cleared him for eye surgery yet but to please discuss at upcoming visit on 09/08/21.  I added preop FYI to appointment notes so that provider is aware to address at time of OV. Per office protocol, the provider seeing this patient should forward their finalized clearance decision to requesting party below. Will route as FYI to requesting provider. Will remove from preop box as separate preop APP input not necessary at this time.  Charlie Pitter, PA-C 08/24/2021, 10:45 AM

## 2021-09-08 ENCOUNTER — Other Ambulatory Visit: Payer: Self-pay

## 2021-09-08 ENCOUNTER — Encounter: Payer: Self-pay | Admitting: Cardiology

## 2021-09-08 ENCOUNTER — Ambulatory Visit: Payer: Medicare HMO | Admitting: Cardiology

## 2021-09-08 VITALS — BP 150/64 | HR 69 | Ht 73.0 in | Wt 238.0 lb

## 2021-09-08 DIAGNOSIS — I10 Essential (primary) hypertension: Secondary | ICD-10-CM | POA: Diagnosis not present

## 2021-09-08 DIAGNOSIS — Z01810 Encounter for preprocedural cardiovascular examination: Secondary | ICD-10-CM

## 2021-09-08 DIAGNOSIS — I350 Nonrheumatic aortic (valve) stenosis: Secondary | ICD-10-CM

## 2021-09-08 DIAGNOSIS — I2510 Atherosclerotic heart disease of native coronary artery without angina pectoris: Secondary | ICD-10-CM | POA: Diagnosis not present

## 2021-09-08 DIAGNOSIS — I351 Nonrheumatic aortic (valve) insufficiency: Secondary | ICD-10-CM

## 2021-09-08 DIAGNOSIS — R931 Abnormal findings on diagnostic imaging of heart and coronary circulation: Secondary | ICD-10-CM | POA: Diagnosis not present

## 2021-09-08 DIAGNOSIS — E7800 Pure hypercholesterolemia, unspecified: Secondary | ICD-10-CM | POA: Diagnosis not present

## 2021-09-08 MED ORDER — AMOXICILLIN 500 MG PO TABS: ORAL_TABLET | ORAL | 0 refills | Status: AC

## 2021-09-08 NOTE — Patient Instructions (Signed)
Medication Instructions:  ?Your physician has recommended you make the following change in your medication:  ?Start taking your lipitor every other day ? ?*If you need a refill on your cardiac medications before your next appointment, please call your pharmacy* ? ? ?Lab Work: ?NONE ?If you have labs (blood work) drawn today and your tests are completely normal, you will receive your results only by: ?MyChart Message (if you have MyChart) OR ?A paper copy in the mail ?If you have any lab test that is abnormal or we need to change your treatment, we will call you to review the results. ? ? ?Testing/Procedures: ?Your physician has requested that you have an echocardiogram. Echocardiography is a painless test that uses sound waves to create images of your heart. It provides your doctor with information about the size and shape of your heart and how well your heart?s chambers and valves are working. This procedure takes approximately one hour. There are no restrictions for this procedure.  ? ? ?Follow-Up: ?At [facility], you and your health needs are our priority.  As part of our continuing mission to provide you with exceptional heart care, we have created designated Provider Care Teams.  These Care Teams include your primary Cardiologist (physician) and Advanced Practice Providers (APPs -  Physician Assistants and Nurse Practitioners) who all work together to provide you with the care you need, when you need it. ? ?We recommend signing up for the patient portal called "MyChart".  Sign up information is provided on this After Visit Summary.  MyChart is used to connect with patients for Virtual Visits (Telemedicine).  Patients are able to view lab/test results, encounter notes, upcoming appointments, etc.  Non-urgent messages can be sent to your provider as well.   ?To learn more about what you can do with MyChart, go to [email or web address].   ? ?Your next appointment:   ?6 month(s) ? ?The format for your next  appointment:   ?In Person ? ?Provider:   ?Shirlee More, MD  ? ? ?Other Instructions ?  ?

## 2021-09-08 NOTE — Progress Notes (Signed)
?Cardiology Office Note:   ? ?Date:  09/08/2021  ? ?ID:  Patrick Mcdaniel, DOB [birth year], MRN [id number] ? ?PCP:  Mateo Flow, MD  ?Cardiologist:  Shirlee More, MD   ? ?Referring MD: Mateo Flow, MD  ? ? ?ASSESSMENT:   ? ?1. Nonrheumatic aortic valve stenosis   ?2. Nonrheumatic aortic valve insufficiency   ?3. Primary hypertension   ?4. Elevated cholesterol   ?5. Mild CAD   ?6. Agatston coronary artery calcium score between 100 and 199   ? ?PLAN:   ? ?In order of problems listed above: ? ?Close to his last visit I understand better the nature of his heart disease.  He has calcific aortic valve disease and mild stenosis moderate regurgitation asymptomatic.  He has a disproportionately high coronary calcium score and concern he may progress quicker than others unguinal repeat an echocardiogram in 6 months and see him back in my office.  At this time I would not advise consideration of intervention unless he has left ventricular dysfunction with reduced ejection fraction dilated left ventricle or were to develop symptoms like heart failure.  He will initiate endocarditis prophylaxis with amoxicillin for dental visits ?Stable hypertension with his aortic regurgitation all except a systolic of 570 with a wide pulse pressure of 90 mmHg. ?Continue statin but with muscle weakness decreased to every other day and if ongoing persistent may require a different statin pravastatin or PCSK9 inhibitor ?Mild CAD nonflow limiting having no anginal discomfort ?Continue his statin ?He is having eye surgery low risk and is optimized from my perspective I will send a note to his eye surgeon ? ? ?Next appointment: 6 months echocardiogram 1 week before his visit ? ? ?Medication Adjustments/Labs and Tests Ordered: ?Current medicines are reviewed at length with the patient today.  Concerns regarding medicines are outlined above.  ?No orders of the defined types were placed in this encounter. ? ?No orders of the defined types were placed in  this encounter. ? ?Chief complaint follow-up after extensive cardiac testing ? ? ?History of Present Illness:   ? ?Patrick Mcdaniel is a 76 y.o. male with a hx of hypertension hyperlipidemia type 2 diabetes and aortic valve stenosis and regurgitation last seen 07/21/2021. ? ?Compliance with diet, lifestyle and medications: Yes ? ?I independently reviewed his echocardiogram I would define his aortic regurgitation is moderate stenosis is mild the valve is quite calcified likely trileaflet but may be functionally bicuspid due to calcification. ? ?He complains of muscle weakness and asked him to reduce his statin to every other day ? ?He has no exercise intolerance edema shortness of breath or exertional chest pain. ?At times he gets brief momentary positional chest discomfort on his left side. ? ?We reviewed the results of his cardiac CTA aortic valve calcium score which is quite high and his echocardiogram with mild stenosis moderate regurgitation ? ?Echocardiogram performed my office 07/27/2021 showed mild aortic regurgitation and mild aortic stenosis.  Aortic valve mean gradient 12 mmHg aortic valve pressure half-time 354 ms.  Aortic valve was described as tricuspid.  ? ?Cardiac CTA showed three-vessel coronary atherosclerosis and high aortic valve calcium score of 2016.  CAD was present 29 to 49% in left circumflex and marginal branch proximal LAD and 1 to 24% in proximal mid right coronary artery. ?Past Medical History:  ?Diagnosis Date  ? Benign non-nodular prostatic hyperplasia with lower urinary tract symptoms   ? Calculus of gallbladder with chronic cholecystitis without obstruction 02/09/2021  ? Elevated  cholesterol   ? Major depressive disorder with single episode, remission status unspecified   ? Type 2 diabetes mellitus with hypercholesterolemia ([place])   ? ? ?Past Surgical History:  ?Procedure Laterality Date  ? CHOLECYSTECTOMY    ? POLYPECTOMY  1981  ? Urethral polyps by Dr. Judeen Hammans  ? ? ?Current  Medications: ?Current Meds  ?Medication Sig  ? atorvastatin (LIPITOR) 10 MG tablet Take 10 mg by mouth daily.  ? lisinopril (ZESTRIL) 10 MG tablet Take 1 tablet by mouth daily.  ? tamsulosin (FLOMAX) 0.4 MG CAPS capsule Take 0.4 mg by mouth daily.  ?  ? ?Allergies:   Patient has no known allergies.  ? ?Social History  ? ?Socioeconomic History  ? Marital status: Married  ?  Spouse name: Not on file  ? Number of children: Not on file  ? Years of education: Not on file  ? Highest education level: Not on file  ?Occupational History  ? Not on file  ?Tobacco Use  ? Smoking status: Never  ?  Passive exposure: Never  ? Smokeless tobacco: Never  ?Vaping Use  ? Vaping Use: Never used  ?Substance and Sexual Activity  ? Alcohol use: Yes  ?  Alcohol/week: 7.0 - 14.0 standard drinks  ?  Types: 7 - 14 Cans of beer per week  ? Drug use: Never  ? Sexual activity: Not on file  ?Other Topics Concern  ? Not on file  ?Social History Narrative  ? Not on file  ? ?Social Determinants of Health  ? ?Financial Resource Strain: Not on file  ?Food Insecurity: Not on file  ?Transportation Needs: Not on file  ?Physical Activity: Not on file  ?Stress: Not on file  ?Social Connections: Not on file  ?  ? ?Family History: ?The patient's family history includes COPD in his father; Heart attack in his father; Hypercholesterolemia in his father; Hypertension in his father and mother. ?ROS:   ?Please see the history of present illness.    ?All other systems reviewed and are negative. ? ?EKGs/Labs/Other Studies Reviewed:   ? ?The following studies were reviewed today: ? ?EKG:  EKG ordered today and personally reviewed.  The ekg ordered today demonstrates sinus rhythm left axis deviation late transition in the precordial leads possible inferior and anterior MI versus axis deviation ? ?Recent Labs: ?08/18/2021: BUN 14; Creatinine, Ser 0.99; Potassium 4.9; Sodium 139  ?Recent Lipid Panel ?No results found for: CHOL, TRIG, HDL, CHOLHDL, VLDL, LDLCALC,  LDLDIRECT ? ?Physical Exam:   ? ?VS:  BP (!) 150/64 (BP Location: Right Arm)   Pulse 69   Ht '6\' 1"'$  (1.854 m)   Wt 238 lb (108 kg)   SpO2 96%   BMI 31.40 kg/m?    ? ?Wt Readings from Last 3 Encounters:  ?09/08/21 238 lb (108 kg)  ?07/21/21 233 lb 6.4 oz (105.9 kg)  ?05/18/21 225 lb (102.1 kg)  ?  ? ?GEN:  Well nourished, well developed in no acute distress ?HEENT: Normal ?NECK: No JVD; No carotid bruits ?LYMPHATICS: No lymphadenopathy ?CARDIAC: He has a grade 1/6 to 2/6 aortic stenosis murmur does not radiate to the carotids S2 normal grade 2/6 murmur aortic regurgitation right sternal border to the left sternal border.  RRR,rubs, gallops ?RESPIRATORY:  Clear to auscultation without rales, wheezing or rhonchi  ?ABDOMEN: Soft, non-tender, non-distended ?MUSCULOSKELETAL:  No edema; No deformity  ?SKIN: Warm and dry ?NEUROLOGIC:  Alert and oriented x 3 ?PSYCHIATRIC:  Normal affect  ? ? ?Signed, ?Aaron Edelman  Bettina Gavia, MD  ?09/08/2021 4:55 PM    ?[place]  ?

## 2021-09-22 DIAGNOSIS — Z6830 Body mass index (BMI) 30.0-30.9, adult: Secondary | ICD-10-CM | POA: Diagnosis not present

## 2021-09-22 DIAGNOSIS — J40 Bronchitis, not specified as acute or chronic: Secondary | ICD-10-CM | POA: Diagnosis not present

## 2021-09-22 DIAGNOSIS — J329 Chronic sinusitis, unspecified: Secondary | ICD-10-CM | POA: Diagnosis not present

## 2021-11-01 DIAGNOSIS — H43391 Other vitreous opacities, right eye: Secondary | ICD-10-CM | POA: Diagnosis not present

## 2021-11-01 DIAGNOSIS — H43813 Vitreous degeneration, bilateral: Secondary | ICD-10-CM | POA: Diagnosis not present

## 2021-11-10 DIAGNOSIS — H43391 Other vitreous opacities, right eye: Secondary | ICD-10-CM | POA: Diagnosis not present

## 2021-12-29 DIAGNOSIS — H43391 Other vitreous opacities, right eye: Secondary | ICD-10-CM | POA: Diagnosis not present

## 2021-12-29 DIAGNOSIS — H43393 Other vitreous opacities, bilateral: Secondary | ICD-10-CM | POA: Diagnosis not present

## 2022-01-20 DIAGNOSIS — E1169 Type 2 diabetes mellitus with other specified complication: Secondary | ICD-10-CM | POA: Diagnosis not present

## 2022-01-20 DIAGNOSIS — E7800 Pure hypercholesterolemia, unspecified: Secondary | ICD-10-CM | POA: Diagnosis not present

## 2022-01-23 DIAGNOSIS — I350 Nonrheumatic aortic (valve) stenosis: Secondary | ICD-10-CM | POA: Diagnosis not present

## 2022-01-23 DIAGNOSIS — I10 Essential (primary) hypertension: Secondary | ICD-10-CM | POA: Diagnosis not present

## 2022-01-23 DIAGNOSIS — E7800 Pure hypercholesterolemia, unspecified: Secondary | ICD-10-CM | POA: Diagnosis not present

## 2022-01-23 DIAGNOSIS — E1169 Type 2 diabetes mellitus with other specified complication: Secondary | ICD-10-CM | POA: Diagnosis not present

## 2022-01-23 DIAGNOSIS — I351 Nonrheumatic aortic (valve) insufficiency: Secondary | ICD-10-CM | POA: Diagnosis not present

## 2022-01-23 DIAGNOSIS — Z6830 Body mass index (BMI) 30.0-30.9, adult: Secondary | ICD-10-CM | POA: Diagnosis not present

## 2022-01-31 DIAGNOSIS — R69 Illness, unspecified: Secondary | ICD-10-CM | POA: Diagnosis not present

## 2022-02-09 DIAGNOSIS — H43393 Other vitreous opacities, bilateral: Secondary | ICD-10-CM | POA: Diagnosis not present

## 2022-02-09 DIAGNOSIS — E119 Type 2 diabetes mellitus without complications: Secondary | ICD-10-CM | POA: Diagnosis not present

## 2022-02-09 DIAGNOSIS — H43391 Other vitreous opacities, right eye: Secondary | ICD-10-CM | POA: Diagnosis not present

## 2022-02-09 DIAGNOSIS — H43392 Other vitreous opacities, left eye: Secondary | ICD-10-CM | POA: Diagnosis not present

## 2022-02-09 DIAGNOSIS — H353131 Nonexudative age-related macular degeneration, bilateral, early dry stage: Secondary | ICD-10-CM | POA: Diagnosis not present

## 2022-02-21 DIAGNOSIS — Z01818 Encounter for other preprocedural examination: Secondary | ICD-10-CM | POA: Diagnosis not present

## 2022-02-21 DIAGNOSIS — H43392 Other vitreous opacities, left eye: Secondary | ICD-10-CM | POA: Diagnosis not present

## 2022-03-02 DIAGNOSIS — H43392 Other vitreous opacities, left eye: Secondary | ICD-10-CM | POA: Diagnosis not present

## 2022-03-02 DIAGNOSIS — H43393 Other vitreous opacities, bilateral: Secondary | ICD-10-CM | POA: Diagnosis not present

## 2022-03-30 DIAGNOSIS — H43392 Other vitreous opacities, left eye: Secondary | ICD-10-CM | POA: Diagnosis not present

## 2022-04-24 DIAGNOSIS — H02403 Unspecified ptosis of bilateral eyelids: Secondary | ICD-10-CM | POA: Diagnosis not present

## 2022-04-24 DIAGNOSIS — H02834 Dermatochalasis of left upper eyelid: Secondary | ICD-10-CM | POA: Diagnosis not present

## 2022-04-24 DIAGNOSIS — H02831 Dermatochalasis of right upper eyelid: Secondary | ICD-10-CM | POA: Diagnosis not present

## 2022-05-08 DIAGNOSIS — J329 Chronic sinusitis, unspecified: Secondary | ICD-10-CM | POA: Diagnosis not present

## 2022-05-08 DIAGNOSIS — J40 Bronchitis, not specified as acute or chronic: Secondary | ICD-10-CM | POA: Diagnosis not present

## 2022-05-08 DIAGNOSIS — Z6831 Body mass index (BMI) 31.0-31.9, adult: Secondary | ICD-10-CM | POA: Diagnosis not present

## 2022-07-04 DIAGNOSIS — H02834 Dermatochalasis of left upper eyelid: Secondary | ICD-10-CM | POA: Diagnosis not present

## 2022-07-04 DIAGNOSIS — E119 Type 2 diabetes mellitus without complications: Secondary | ICD-10-CM | POA: Diagnosis not present

## 2022-07-04 DIAGNOSIS — H02403 Unspecified ptosis of bilateral eyelids: Secondary | ICD-10-CM | POA: Diagnosis not present

## 2022-07-04 DIAGNOSIS — H02831 Dermatochalasis of right upper eyelid: Secondary | ICD-10-CM | POA: Diagnosis not present

## 2022-07-04 DIAGNOSIS — H53453 Other localized visual field defect, bilateral: Secondary | ICD-10-CM | POA: Diagnosis not present

## 2022-07-13 DIAGNOSIS — H43391 Other vitreous opacities, right eye: Secondary | ICD-10-CM | POA: Diagnosis not present

## 2022-07-13 DIAGNOSIS — E119 Type 2 diabetes mellitus without complications: Secondary | ICD-10-CM | POA: Diagnosis not present

## 2022-07-13 DIAGNOSIS — H43392 Other vitreous opacities, left eye: Secondary | ICD-10-CM | POA: Diagnosis not present

## 2022-07-13 DIAGNOSIS — H43393 Other vitreous opacities, bilateral: Secondary | ICD-10-CM | POA: Diagnosis not present

## 2022-07-13 DIAGNOSIS — H353131 Nonexudative age-related macular degeneration, bilateral, early dry stage: Secondary | ICD-10-CM | POA: Diagnosis not present

## 2022-09-01 ENCOUNTER — Other Ambulatory Visit: Payer: Self-pay

## 2022-09-01 DIAGNOSIS — I350 Nonrheumatic aortic (valve) stenosis: Secondary | ICD-10-CM

## 2022-09-13 ENCOUNTER — Ambulatory Visit: Payer: Medicare HMO | Attending: Cardiology

## 2022-09-13 DIAGNOSIS — I350 Nonrheumatic aortic (valve) stenosis: Secondary | ICD-10-CM

## 2022-09-13 LAB — ECHOCARDIOGRAM COMPLETE
AR max vel: 1.66 cm2
AV Area VTI: 1.83 cm2
AV Area mean vel: 1.55 cm2
AV Mean grad: 16 mmHg
AV Peak grad: 27.2 mmHg
Ao pk vel: 2.61 m/s
Area-P 1/2: 2.82 cm2
P 1/2 time: 382 msec
S' Lateral: 4 cm

## 2022-09-19 DIAGNOSIS — E1169 Type 2 diabetes mellitus with other specified complication: Secondary | ICD-10-CM | POA: Diagnosis not present

## 2022-09-19 DIAGNOSIS — E7800 Pure hypercholesterolemia, unspecified: Secondary | ICD-10-CM | POA: Diagnosis not present

## 2022-09-20 DIAGNOSIS — Z0000 Encounter for general adult medical examination without abnormal findings: Secondary | ICD-10-CM | POA: Diagnosis not present

## 2022-09-20 DIAGNOSIS — E1169 Type 2 diabetes mellitus with other specified complication: Secondary | ICD-10-CM | POA: Diagnosis not present

## 2022-09-20 DIAGNOSIS — E7800 Pure hypercholesterolemia, unspecified: Secondary | ICD-10-CM | POA: Diagnosis not present

## 2022-09-20 DIAGNOSIS — Z9181 History of falling: Secondary | ICD-10-CM | POA: Diagnosis not present

## 2022-09-20 DIAGNOSIS — N401 Enlarged prostate with lower urinary tract symptoms: Secondary | ICD-10-CM | POA: Diagnosis not present

## 2022-09-20 DIAGNOSIS — Z6830 Body mass index (BMI) 30.0-30.9, adult: Secondary | ICD-10-CM | POA: Diagnosis not present

## 2022-09-20 DIAGNOSIS — I10 Essential (primary) hypertension: Secondary | ICD-10-CM | POA: Diagnosis not present

## 2022-09-20 DIAGNOSIS — L820 Inflamed seborrheic keratosis: Secondary | ICD-10-CM | POA: Diagnosis not present

## 2022-09-20 DIAGNOSIS — L821 Other seborrheic keratosis: Secondary | ICD-10-CM | POA: Diagnosis not present

## 2022-10-31 NOTE — Progress Notes (Unsigned)
Cardiology Office Note:    Date:  11/01/2022   ID:  Patrick Mcdaniel, DOB 1945-12-31, MRN [id number]  PCP:  Lise Auer, MD  Cardiologist:  Norman Herrlich, MD    Referring MD: Lise Auer, MD    ASSESSMENT:    1. Nonrheumatic aortic valve stenosis   2. Nonrheumatic aortic valve insufficiency   3. Primary hypertension   4. Elevated cholesterol   5. Mild CAD   6. Mild aortic stenosis    PLAN:    In order of problems listed above:  Stable valvular heart disease I will plan to recheck echocardiogram before his next visit he is asymptomatic no murmur on physical exam especially with his disproportionate elevated aortic valve calcium score Blood pressure is controlled I asked him to check his blood pressures at home daily 2 weeks and leave me a list Continue his statin with CAD elevated calcium score   Next appointment: 1 year   Medication Adjustments/Labs and Tests Ordered: Current medicines are reviewed at length with the patient today.  Concerns regarding medicines are outlined above.  No orders of the defined types were placed in this encounter.  No orders of the defined types were placed in this encounter.   Chief Complaint  Patient presents with   Follow-up   Coronary Artery Disease    History of Present Illness:    Patrick Mcdaniel is a 77 y.o. male with a hx of hypertension hyperlipidemia type 2 diabetes and aortic valve disease with stenosis mild and regurgitation moderate last seen 09/08/2021.  Cardiac CTA showed a high aortic valve calcium score 2016 with mild CAD 25 to 49% left circumflex and marginal branch and proximal LAD.  He is poorly statin intolerant.  Compliance with diet, lifestyle and medications: Yes  Overall he is doing well no angina edema shortness of breath palpitation or syncope Relates recent lipid profile with his PCP the last I can see is July 2023 with a cholesterol 195 triglycerides 128 and I have requested those labs Takes over-the-counter  Alka-Seltzer as aspirin 1 daily Past Medical History:  Diagnosis Date   Benign non-nodular prostatic hyperplasia with lower urinary tract symptoms    Calculus of gallbladder with chronic cholecystitis without obstruction 02/09/2021   Elevated cholesterol    Major depressive disorder with single episode, remission status unspecified    Type 2 diabetes mellitus with hypercholesterolemia ([facility])     Past Surgical History:  Procedure Laterality Date   CHOLECYSTECTOMY     POLYPECTOMY  1981   Urethral polyps by Dr. Einar Grad    Current Medications: Current Meds  Medication Sig   amoxicillin (AMOXIL) 500 MG tablet Take 4 Tablets 45 minutes prior to dental exam   atorvastatin (LIPITOR) 10 MG tablet Take 10 mg by mouth every other day.   lisinopril (ZESTRIL) 10 MG tablet Take 1 tablet by mouth daily.   tamsulosin (FLOMAX) 0.4 MG CAPS capsule Take 0.4 mg by mouth daily.     Allergies:   Patient has no known allergies.   Social History   Socioeconomic History   Marital status: Married    Spouse name: Not on file   Number of children: Not on file   Years of education: Not on file   Highest education level: Not on file  Occupational History   Not on file  Tobacco Use   Smoking status: Never    Passive exposure: Never   Smokeless tobacco: Never  Vaping Use   Vaping Use: Never  used  Substance and Sexual Activity   Alcohol use: Yes    Alcohol/week: 7.0 - 14.0 standard drinks of alcohol    Types: 7 - 14 Cans of beer per week   Drug use: Never   Sexual activity: Not on file  Other Topics Concern   Not on file  Social History Narrative   Not on file   Social Determinants of Health   Financial Resource Strain: Not on file  Food Insecurity: Not on file  Transportation Needs: Not on file  Physical Activity: Not on file  Stress: Not on file  Social Connections: Not on file     Family History: The patient's family history includes COPD in his father; Heart attack in his  father; Hypercholesterolemia in his father; Hypertension in his father and mother. ROS:   Please see the history of present illness.    All other systems reviewed and are negative.  EKGs/Labs/Other Studies Reviewed:    The following studies were reviewed today:  Cardiac Studies & Procedures       ECHOCARDIOGRAM  ECHOCARDIOGRAM COMPLETE 09/13/2022  Narrative ECHOCARDIOGRAM REPORT    Patient Name:   Patrick Mcdaniel Date of Exam: 09/13/2022 Medical Rec #:  [id number]     Height:       73.0 in Accession #:    [id number]    Weight:       238.0 lb Date of Birth:  25-Aug-1945     BSA:          2.317 m Patient Age:    76 years      BP:           133/69 mmHg Patient Gender: M             HR:           57 bpm. Exam Location:  [facility]  Procedure: 2D Echo, 3D Echo, Cardiac Doppler, Color Doppler and Strain Analysis  Indications:    Dyspnea R06.00  History:        Patient has prior history of Echocardiogram examinations, most recent 07/27/2021. Aortic Valve Disease.  Sonographer:    Margreta Journey RDCS Referring Phys: [id number] Larry Alcock J Leonid Manus  IMPRESSIONS   1. Left ventricular ejection fraction, by estimation, is 50 to 55%. The left ventricle has low normal function. The left ventricle has no regional wall motion abnormalities. There is mild concentric left ventricular hypertrophy. Left ventricular diastolic parameters are indeterminate. 2. Right ventricular systolic function is normal. The right ventricular size is mildly enlarged. 3. The mitral valve is degenerative. No evidence of mitral valve regurgitation. No evidence of mitral stenosis. 4. The aortic valve has an indeterminant number of cusps. There is moderate calcification of the aortic valve. Aortic valve regurgitation is moderate. Mild aortic valve stenosis. 5. There is mild dilatation of the ascending aorta, measuring 40 mm.  FINDINGS Left Ventricle: Left ventricular ejection fraction, by estimation, is 50 to 55%. The left  ventricle has low normal function. The left ventricle has no regional wall motion abnormalities. The left ventricular internal cavity size was normal in size. There is mild concentric left ventricular hypertrophy. Left ventricular diastolic parameters are indeterminate. Indeterminate filling pressures.  Right Ventricle: The right ventricular size is mildly enlarged. No increase in right ventricular wall thickness. Right ventricular systolic function is normal.  Left Atrium: Left atrial size was normal in size.  Right Atrium: Right atrial size was normal in size.  Pericardium: There is no evidence of pericardial effusion.  Mitral  Valve: The mitral valve is degenerative in appearance. Mild mitral annular calcification. No evidence of mitral valve regurgitation. No evidence of mitral valve stenosis.  Tricuspid Valve: The tricuspid valve is normal in structure. Tricuspid valve regurgitation is not demonstrated. No evidence of tricuspid stenosis.  Aortic Valve: The aortic valve has an indeterminant number of cusps. There is moderate calcification of the aortic valve. Aortic valve regurgitation is moderate. Aortic regurgitation PHT measures 382 msec. Mild aortic stenosis is present. Aortic valve mean gradient measures 16.0 mmHg. Aortic valve peak gradient measures 27.2 mmHg. Aortic valve area, by VTI measures 1.83 cm.  Pulmonic Valve: The pulmonic valve was normal in structure. Pulmonic valve regurgitation is not visualized. No evidence of pulmonic stenosis.  Aorta: The aortic root is normal in size and structure and the aortic arch was not well visualized. There is mild dilatation of the ascending aorta, measuring 40 mm.  Venous: The pulmonary veins were not well visualized. The inferior vena cava was not well visualized.  IAS/Shunts: No atrial level shunt detected by color flow Doppler.   LEFT VENTRICLE PLAX 2D LVIDd:         5.70 cm   Diastology LVIDs:         4.00 cm   LV e' medial:     4.38 cm/s LV PW:         1.30 cm   LV E/e' medial:  16.6 LV IVS:        1.40 cm   LV e' lateral:   3.94 cm/s LVOT diam:     2.40 cm   LV E/e' lateral: 18.5 LV SV:         122 LV SV Index:   53 LVOT Area:     4.52 cm  3D Volume EF: 3D EF:        47 % LV EDV:       208 ml LV ESV:       111 ml LV SV:        97 ml  RIGHT VENTRICLE RV Basal diam:  4.20 cm RV Mid diam:    3.40 cm RV S prime:     14.90 cm/s TAPSE (M-mode): 2.8 cm  LEFT ATRIUM             Index        RIGHT ATRIUM           Index LA diam:        4.00 cm 1.73 cm/m   RA Area:     24.20 cm LA Vol (A2C):   71.1 ml 30.69 ml/m  RA Volume:   73.40 ml  31.68 ml/m LA Vol (A4C):   52.0 ml 22.45 ml/m LA Biplane Vol: 62.6 ml 27.02 ml/m AORTIC VALVE                     PULMONIC VALVE AV Area (Vmax):    1.66 cm      PR End Diast Vel: 7.95 msec AV Area (Vmean):   1.55 cm AV Area (VTI):     1.83 cm AV Vmax:           261.00 cm/s AV Vmean:          189.500 cm/s AV VTI:            0.666 m AV Peak Grad:      27.2 mmHg AV Mean Grad:      16.0 mmHg LVOT Vmax:  95.60 cm/s LVOT Vmean:        64.800 cm/s LVOT VTI:          0.269 m LVOT/AV VTI ratio: 0.40 AI PHT:            382 msec  AORTA Ao Root diam: 3.80 cm Ao Asc diam:  4.00 cm  MITRAL VALVE MV Area (PHT): 2.82 cm     SHUNTS MV Decel Time: 269 msec     Systemic VTI:  0.27 m MV E velocity: 72.70 cm/s   Systemic Diam: 2.40 cm MV A velocity: 101.00 cm/s MV E/A ratio:  0.72  Norman Herrlich MD Electronically signed by Norman Herrlich MD Signature Date/Time: 09/13/2022/5:30:11 PM    Final     CT SCANS  CT CORONARY MORPH W/CTA COR W/SCORE 08/23/2021  Addendum 08/23/2021 11:21 AM ADDENDUM REPORT: 08/23/2021 11:18  ADDENDUM: The following report is an over-read performed by radiologist Dr. Maudry Mayhew of [facility] on August 23, 2021. This over-read does not include interpretation of cardiac or coronary anatomy or pathology. The coronary  calcium score/coronary CTA interpretation by the cardiologist is attached.  COMPARISON:  CT September 01, 2011  FINDINGS: Vascular: Visualized portions of the thoracic aorta are normal in caliber. Calcifications aortic valve. No central pulmonary embolus on this nondedicated study.  Mediastinum/Nodes: No mediastinal or hilar adenopathy. Visualized portions of the esophagus are grossly unremarkable.  Lungs/Pleura: No suspicious pulmonary nodules or masses. No pleural effusion. No pneumothorax.  Upper Abdomen: No acute abnormality.  Musculoskeletal: Thoracic spondylosis. No acute osseous abnormality.  IMPRESSION: No acute finding or suspicious pulmonary nodules.   Electronically Signed By: Maudry Mayhew M.D. On: 08/23/2021 11:18  Narrative CLINICAL DATA:  Chest pain  EXAM: Cardiac CTA  MEDICATIONS: Sub lingual nitro. 4mg  and lopressor 100mg   TECHNIQUE: The patient was scanned on a Siemens Force 192 slice scanner. Gantry rotation speed was 250 msecs. Collimation was .6 mm. A 100 kV prospective scan was triggered in the ascending thoracic aorta at 140 HU's Full mA was used between 35% and 75% of the R-R interval. Average HR during the scan was 56 bpm. The 3D data set was interpreted on a dedicated work station using MPR, MIP and VRT modes. A total of 80 cc of contrast was used.  FINDINGS: Non-cardiac: See separate report from [facility]. No significant findings on limited lung and soft tissue windows.  Calcium Score: 3 vessel coronary calcium noted  AV calcium score 2016  LM: 0  LAD: 26.7  LCX: 93.2  RCA: 16.4  Total: 136 which is 39 th percentile for age/sex  Coronary Arteries: Right dominant with no anomalies  LM: Normal  LAD: 1-24% calcified plaque in proximal vessel 25-49% calcified plaque in mid vessel  D1: Normal  D2: Normal  Circumflex: 25-49% % proximal calcified plaque  OM1: 25-49% calcified plaque in mid vessel  OM2:  Normal  RCA: 1-24% calcified plaque in proximal and mid vessel  PDA: Normal  PLA: Normal  IMPRESSION: 1. Calcium score 136 which is 39 th percentile for age/sex  2. Aortic Valve calcium score 2016 suggesting significant aortic stenosis Tri leaflet AV with partially fused left / right cusps  3.  CAD RADS 2 no obstructive CAD see description above  4.  Normal ascending thoracic aorta 3.5 cm  Charlton Haws  Electronically Signed: By: Charlton Haws M.D. On: 08/22/2021 16:16          EKG:  EKG ordered today and personally reviewed.  The  ekg ordered today demonstrates sinus rhythm poor R wave progression otherwise normal EKG   Physical Exam:    VS:  BP (!) 172/72 (BP Location: Right Arm, Patient Position: Sitting)   Pulse 64   Ht 6\' 1"  (1.854 m)   Wt 236 lb (107 kg)   SpO2 98%   BMI 31.14 kg/m     Wt Readings from Last 3 Encounters:  11/01/22 236 lb (107 kg)  09/08/21 238 lb (108 kg)  07/21/21 233 lb 6.4 oz (105.9 kg)     GEN:  Well nourished, well developed in no acute distress HEENT: Normal NECK: No JVD; No carotid bruits LYMPHATICS: No lymphadenopathy CARDIAC: RRR, no murmurs, rubs, gallops RESPIRATORY:  Clear to auscultation without rales, wheezing or rhonchi  ABDOMEN: Soft, non-tender, non-distended MUSCULOSKELETAL:  No edema; No deformity  SKIN: Warm and dry NEUROLOGIC:  Alert and oriented x 3 PSYCHIATRIC:  Normal affect    Signed, Norman Herrlich, MD  11/01/2022 1:37 PM    [facility]

## 2022-11-01 ENCOUNTER — Encounter: Payer: Self-pay | Admitting: Cardiology

## 2022-11-01 ENCOUNTER — Ambulatory Visit: Payer: Medicare HMO | Attending: Cardiology | Admitting: Cardiology

## 2022-11-01 VITALS — BP 150/80 | HR 64 | Ht 73.0 in | Wt 236.0 lb

## 2022-11-01 DIAGNOSIS — I10 Essential (primary) hypertension: Secondary | ICD-10-CM

## 2022-11-01 DIAGNOSIS — I350 Nonrheumatic aortic (valve) stenosis: Secondary | ICD-10-CM | POA: Diagnosis not present

## 2022-11-01 DIAGNOSIS — I351 Nonrheumatic aortic (valve) insufficiency: Secondary | ICD-10-CM | POA: Diagnosis not present

## 2022-11-01 DIAGNOSIS — E7800 Pure hypercholesterolemia, unspecified: Secondary | ICD-10-CM | POA: Diagnosis not present

## 2022-11-01 DIAGNOSIS — I2510 Atherosclerotic heart disease of native coronary artery without angina pectoris: Secondary | ICD-10-CM

## 2022-11-01 NOTE — Patient Instructions (Addendum)
Medication Instructions:  Your physician recommends that you continue on your current medications as directed. Please refer to the Current Medication list given to you today.  *If you need a refill on your cardiac medications before your next appointment, please call your pharmacy*   Lab Work: None If you have labs (blood work) drawn today and your tests are completely normal, you will receive your results only by: MyChart Message (if you have MyChart) OR A paper copy in the mail If you have any lab test that is abnormal or we need to change your treatment, we will call you to review the results.   Testing/Procedures: Your physician has requested that you have an echocardiogram. Echocardiography is a painless test that uses sound waves to create images of your heart. It provides your doctor with information about the size and shape of your heart and how well your heart's chambers and valves are working. This procedure takes approximately one hour. There are no restrictions for this procedure. Please do NOT wear cologne, perfume, aftershave, or lotions (deodorant is allowed). Please arrive 15 minutes prior to your appointment time.    Follow-Up: At [facility], you and your health needs are our priority.  As part of our continuing mission to provide you with exceptional heart care, we have created designated Provider Care Teams.  These Care Teams include your primary Cardiologist (physician) and Advanced Practice Providers (APPs -  Physician Assistants and Nurse Practitioners) who all work together to provide you with the care you need, when you need it.  We recommend signing up for the patient portal called "MyChart".  Sign up information is provided on this After Visit Summary.  MyChart is used to connect with patients for Virtual Visits (Telemedicine).  Patients are able to view lab/test results, encounter notes, upcoming appointments, etc.  Non-urgent messages can be sent to your  provider as well.   To learn more about what you can do with MyChart, go to [email or web address].    Your next appointment:   1 year(s)  Provider:   Norman Herrlich, MD    Other Instructions Check blood pressures daily and record. Drop off list of blood pressures to the office in 2 weeks    [facility]  Tips to measure your blood pressure correctly  To determine whether you have hypertension, a medical professional will take a blood pressure reading. How you prepare for the test, the position of your arm, and other factors can change a blood pressure reading by 10% or more. That could be enough to hide high blood pressure, start you on a drug you don't really need, or lead your doctor to incorrectly adjust your medications. National and international guidelines offer specific instructions for measuring blood pressure. If a doctor, nurse, or medical assistant isn't doing it right, don't hesitate to ask him or her to get with the guidelines. Here's what you can do to ensure a correct reading:  Don't drink a caffeinated beverage or smoke during the 30 minutes before the test.  Sit quietly for five minutes before the test begins.  During the measurement, sit in a chair with your feet on the floor and your arm supported so your elbow is at about heart level.  The inflatable part of the cuff should completely cover at least 80% of your upper arm, and the cuff should be placed on bare skin, not over a shirt.  Don't talk during the measurement.  Have your blood pressure measured twice, with a brief  break in between. If the readings are different by 5 points or more, have it done a third time. There are times to break these rules. If you sometimes feel lightheaded when getting out of bed in the morning or when you stand after sitting, you should have your blood pressure checked while seated and then while standing to see if it falls from one position to the next. Because blood pressure varies  throughout the day, your doctor will rarely diagnose hypertension on the basis of a single reading. Instead, he or she will want to confirm the measurements on at least two occasions, usually within a few weeks of one another. The exception to this rule is if you have a blood pressure reading of 180/110 mm Hg or higher. A result this high usually calls for prompt treatment. It's also a good idea to have your blood pressure measured in both arms at least once, since the reading in one arm (usually the right) may be higher than that in the left. A 2014 study in The American Journal of Medicine of nearly 3,400 people found average arm- to-arm differences in systolic blood pressure of about 5 points. The higher number should be used to make treatment decisions. In 2017, new guidelines from the American Heart Association, the [employer] of Cardiology, and nine other health organizations lowered the diagnosis of high blood pressure to 130/80 mm Hg or higher for all adults. The guidelines also redefined the various blood pressure categories to now include normal, elevated, Stage 1 hypertension, Stage 2 hypertension, and hypertensive crisis (see "Blood pressure categories"). Blood pressure categories  Blood pressure category SYSTOLIC (upper number)  DIASTOLIC (lower number)  Normal Less than 120 mm Hg and Less than 80 mm Hg  Elevated 120-129 mm Hg and Less than 80 mm Hg  High blood pressure: Stage 1 hypertension 130-139 mm Hg or 80-89 mm Hg  High blood pressure: Stage 2 hypertension 140 mm Hg or higher or 90 mm Hg or higher  Hypertensive crisis (consult your doctor immediately) Higher than 180 mm Hg and/or Higher than 120 mm Hg  Source: American Heart Association and American Stroke Association. For more on getting your blood pressure under control, buy Controlling Your Blood Pressure, a Special Health Report from [facility].

## 2022-11-01 NOTE — Addendum Note (Signed)
Addended by: Roxanne Mins I on: 11/01/2022 01:55 PM   Modules accepted: Orders

## 2022-11-21 ENCOUNTER — Telehealth: Payer: Self-pay

## 2022-11-21 NOTE — Telephone Encounter (Signed)
Home blood pressure record sheet from patient for dates 11/04/2022 through 11/17/2022. Readings to Dr Norman Herrlich for review

## 2022-11-29 ENCOUNTER — Telehealth: Payer: Self-pay

## 2022-11-29 ENCOUNTER — Other Ambulatory Visit: Payer: Self-pay

## 2022-11-29 DIAGNOSIS — I10 Essential (primary) hypertension: Secondary | ICD-10-CM

## 2022-11-29 MED ORDER — OLMESARTAN MEDOXOMIL-HCTZ 40-12.5 MG PO TABS: 1.0000 | ORAL_TABLET | Freq: Every day | ORAL | 3 refills | Status: DC

## 2022-11-29 NOTE — Telephone Encounter (Signed)
Called patient and informed him of Dr. Hulen Shouts recommendation below:  "Not pleased with current pressure control, I think he benefit from a more potent medication   Stop lisinopril   Start Benicar 40/12.5 drop list BP 2 weeks and we will check a BMP "  Patient verbalized understanding and had no further questions at this time.

## 2023-02-07 IMAGING — CT CT HEART MORP W/ CTA COR W/ SCORE W/ CA W/CM &/OR W/O CM
4 of 7 series · 8 of 20 positions shown, 9 images · IV contrast (APPLIED)
Comparison: CT September 01, 2011

Addendum:
CLINICAL DATA: Chest pain

EXAM:
Cardiac CTA
MEDICATIONS:
Sub lingual nitro. 4mg and lopressor 100mg
TECHNIQUE: The patient was scanned on a Siemens Force [REDACTED]ice scanner. Gantry
rotation speed was 250 msecs. Collimation was .6 mm. A 100 kV
prospective scan was triggered in the ascending thoracic aorta at
140 HU's Full mA was used between 35% and 75% of the R-R interval.
Average HR during the scan was 56 bpm. The 3D data set was
interpreted on a dedicated work station using MPR, MIP and VRT
modes. A total of 80 cc of contrast was used.

[Series 6: best diast · axial · 0.44mm/px · z∈[-154,-115]mm · 2 of 298 slices shown, 3 images]
[im 100/298  vessel]
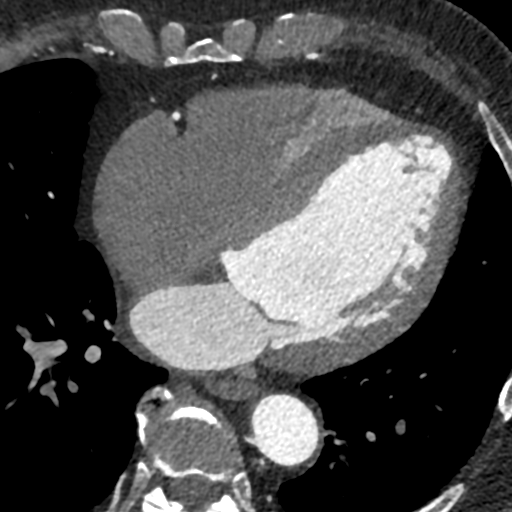
[im 100/298  lung]
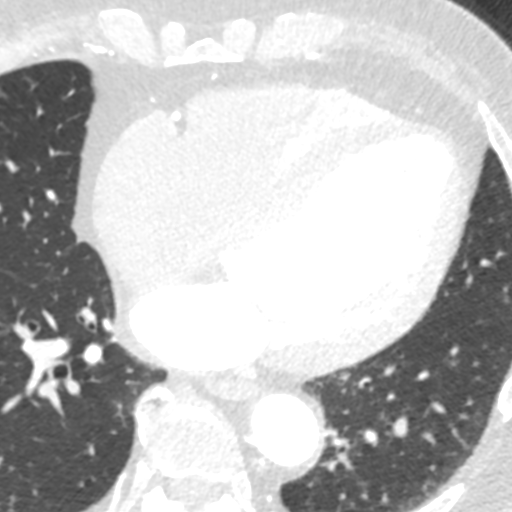
[im 199/298  vessel]
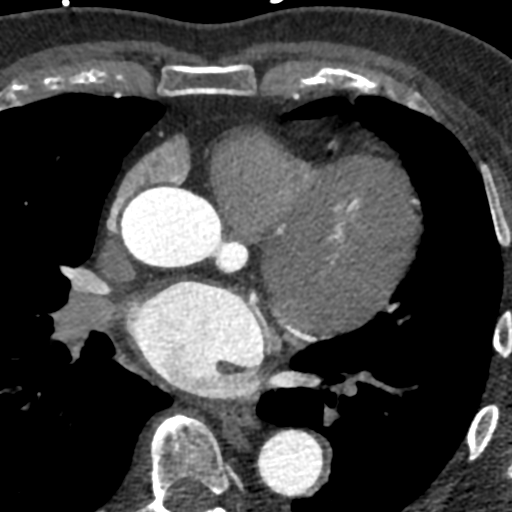

[Series 7: best syst · axial · 0.44mm/px · z∈[-154,-115]mm · 2 of 298 slices shown]
[im 100/298  vessel]
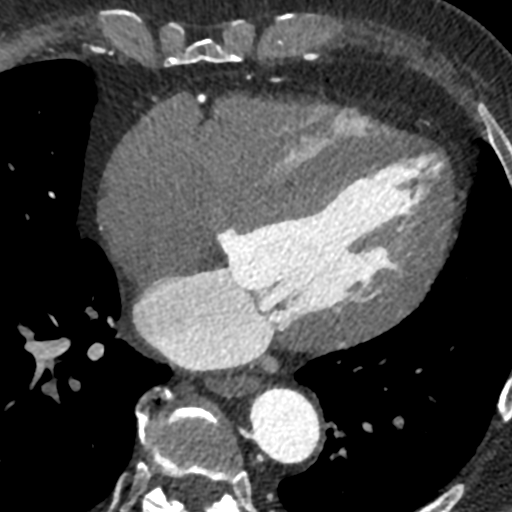
[im 199/298  vessel]
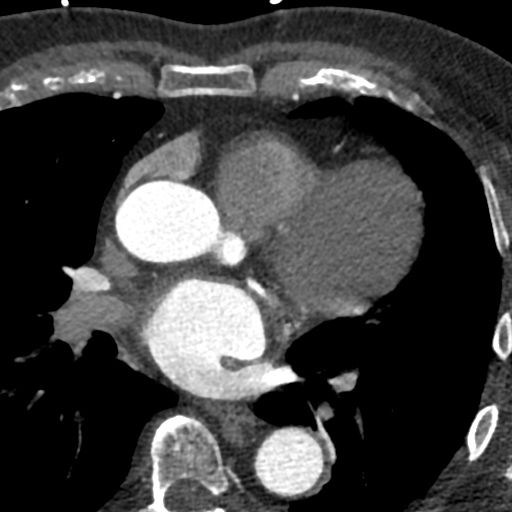

[Series 8: ts diast sharp · axial · 0.44mm/px · z∈[-154,-115]mm · 2 of 298 slices shown]
[im 100/298  lung]
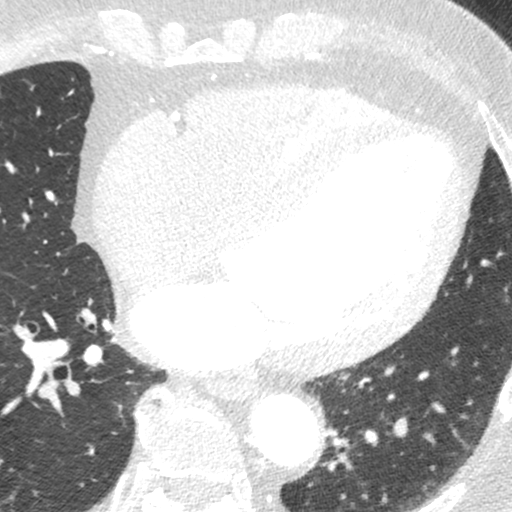
[im 199/298  lung]
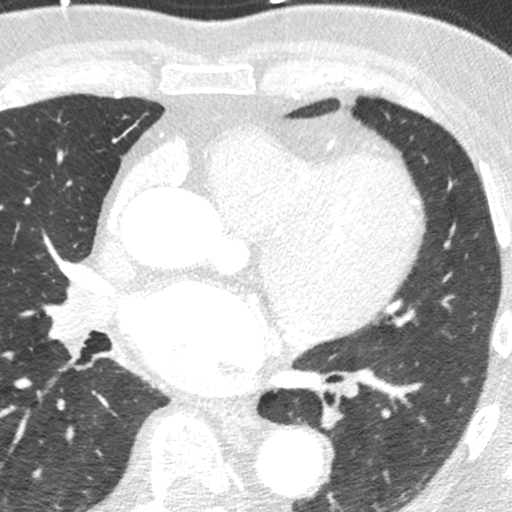

[Series 9: ts syst sharp · axial · 0.44mm/px · z∈[-154,-115]mm · 2 of 298 slices shown]
[im 100/298  lung]
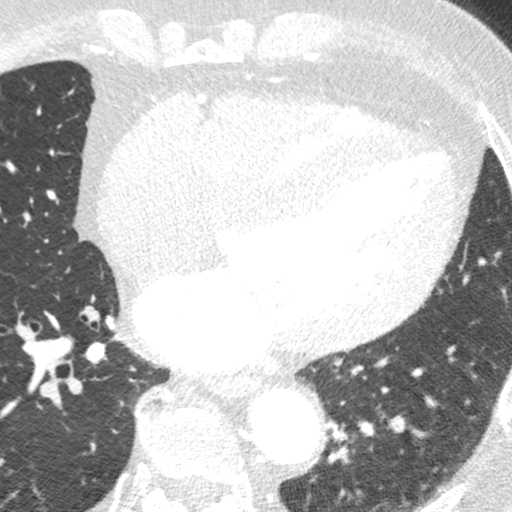
[im 199/298  lung]
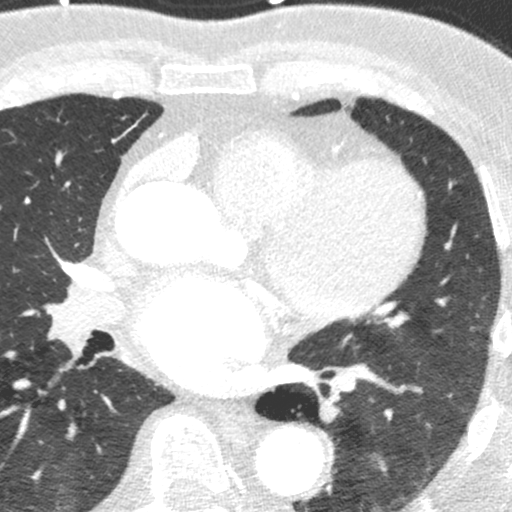

[8 of 20 positions shown; findings below may reference images not displayed]

FINDINGS: Non-cardiac: See separate report from [REDACTED]. No
significant findings on limited lung and soft tissue windows.

Calcium Score: 3 vessel coronary calcium noted

AV calcium score 5316

LM: 0

LAD:

LCX:

RCA:

Total: 136 which is 39 th percentile for age/sex

Coronary Arteries: Right dominant with no anomalies

LM: Normal

LAD: 1-24% calcified plaque in proximal vessel 25-49% calcified
plaque in mid vessel

D1: Normal

D2: Normal

Circumflex: 25-49% % proximal calcified plaque

OM1: 25-49% calcified plaque in mid vessel

OM2: Normal

RCA: 1-24% calcified plaque in proximal and mid vessel

PDA: Normal

PLA: Normal
IMPRESSION: 1. Calcium score 136 which is 39 th percentile for age/sex

2. Aortic Valve calcium score 5316 suggesting significant aortic
stenosis Tri leaflet AV with partially fused left / right cusps

3.  CAD RADS 2 no obstructive CAD see description above

4.  Normal ascending thoracic aorta 3.5 cm

Jhoow Blasius

ADDENDUM:
The following report is an over-read performed by radiologist Dr.
over-read does not include interpretation of cardiac or coronary
anatomy or pathology. The coronary calcium score/coronary CTA
interpretation by the cardiologist is attached.
FINDINGS: Vascular: Visualized portions of the thoracic aorta are normal in
caliber. Calcifications aortic valve. No central pulmonary embolus
on this nondedicated study.

Mediastinum/Nodes: No mediastinal or hilar adenopathy. Visualized
portions of the esophagus are grossly unremarkable.

Lungs/Pleura: No suspicious pulmonary nodules or masses. No pleural
effusion. No pneumothorax.

Upper Abdomen: No acute abnormality.

Musculoskeletal: Thoracic spondylosis. No acute osseous abnormality.
IMPRESSION: No acute finding or suspicious pulmonary nodules.

*** End of Addendum ***
FINDINGS: Non-cardiac: See separate report from [REDACTED]. No
significant findings on limited lung and soft tissue windows.

Calcium Score: 3 vessel coronary calcium noted

AV calcium score 5316

LM: 0

LAD:

LCX:

RCA:

Total: 136 which is 39 th percentile for age/sex

Coronary Arteries: Right dominant with no anomalies

LM: Normal

LAD: 1-24% calcified plaque in proximal vessel 25-49% calcified
plaque in mid vessel

D1: Normal

D2: Normal

Circumflex: 25-49% % proximal calcified plaque

OM1: 25-49% calcified plaque in mid vessel

OM2: Normal

RCA: 1-24% calcified plaque in proximal and mid vessel

PDA: Normal

PLA: Normal
IMPRESSION: 1. Calcium score 136 which is 39 th percentile for age/sex

2. Aortic Valve calcium score 5316 suggesting significant aortic
stenosis Tri leaflet AV with partially fused left / right cusps

3.  CAD RADS 2 no obstructive CAD see description above

4.  Normal ascending thoracic aorta 3.5 cm

Jhoow Blasius

## 2023-07-16 DIAGNOSIS — E1169 Type 2 diabetes mellitus with other specified complication: Secondary | ICD-10-CM | POA: Diagnosis not present

## 2023-07-16 DIAGNOSIS — E7800 Pure hypercholesterolemia, unspecified: Secondary | ICD-10-CM | POA: Diagnosis not present

## 2023-07-18 DIAGNOSIS — H35363 Drusen (degenerative) of macula, bilateral: Secondary | ICD-10-CM | POA: Diagnosis not present

## 2023-07-18 DIAGNOSIS — E119 Type 2 diabetes mellitus without complications: Secondary | ICD-10-CM | POA: Diagnosis not present

## 2023-07-18 DIAGNOSIS — H524 Presbyopia: Secondary | ICD-10-CM | POA: Diagnosis not present

## 2023-07-30 DIAGNOSIS — E7800 Pure hypercholesterolemia, unspecified: Secondary | ICD-10-CM | POA: Diagnosis not present

## 2023-07-30 DIAGNOSIS — I10 Essential (primary) hypertension: Secondary | ICD-10-CM | POA: Diagnosis not present

## 2023-07-30 DIAGNOSIS — E1169 Type 2 diabetes mellitus with other specified complication: Secondary | ICD-10-CM | POA: Diagnosis not present

## 2023-07-30 DIAGNOSIS — N401 Enlarged prostate with lower urinary tract symptoms: Secondary | ICD-10-CM | POA: Diagnosis not present

## 2023-07-30 DIAGNOSIS — Z6831 Body mass index (BMI) 31.0-31.9, adult: Secondary | ICD-10-CM | POA: Diagnosis not present

## 2023-08-06 DIAGNOSIS — J101 Influenza due to other identified influenza virus with other respiratory manifestations: Secondary | ICD-10-CM | POA: Diagnosis not present

## 2023-08-06 DIAGNOSIS — Z6831 Body mass index (BMI) 31.0-31.9, adult: Secondary | ICD-10-CM | POA: Diagnosis not present

## 2023-08-06 DIAGNOSIS — R509 Fever, unspecified: Secondary | ICD-10-CM | POA: Diagnosis not present

## 2023-09-03 ENCOUNTER — Other Ambulatory Visit: Payer: Self-pay

## 2023-09-03 MED ORDER — OLMESARTAN MEDOXOMIL-HCTZ 40-12.5 MG PO TABS: 1.0000 | ORAL_TABLET | Freq: Every day | ORAL | 0 refills | Status: DC

## 2023-09-03 NOTE — Telephone Encounter (Signed)
 Received refill request from [facility] via fax. Prescription sent to pharmacy.

## 2023-09-04 DIAGNOSIS — K644 Residual hemorrhoidal skin tags: Secondary | ICD-10-CM | POA: Diagnosis not present

## 2023-09-04 DIAGNOSIS — Z6830 Body mass index (BMI) 30.0-30.9, adult: Secondary | ICD-10-CM | POA: Diagnosis not present

## 2023-10-04 ENCOUNTER — Telehealth: Payer: Self-pay | Admitting: Cardiology

## 2023-10-04 DIAGNOSIS — I351 Nonrheumatic aortic (valve) insufficiency: Secondary | ICD-10-CM

## 2023-10-04 DIAGNOSIS — I350 Nonrheumatic aortic (valve) stenosis: Secondary | ICD-10-CM

## 2023-10-04 NOTE — Telephone Encounter (Signed)
 Pt called in to sch recall and echo. He would like to have his echo closer to the date when he sees Dr. Dulce Sellar again (12/24/23). The order expires 11/01/23, can a new order be put in to extend out to around 12/21/23.

## 2023-10-04 NOTE — Telephone Encounter (Signed)
 ECHO SCHEDULED/[facility] 10/04/23

## 2023-11-27 ENCOUNTER — Other Ambulatory Visit: Payer: Self-pay | Admitting: Cardiology

## 2023-12-19 ENCOUNTER — Other Ambulatory Visit

## 2023-12-24 ENCOUNTER — Ambulatory Visit: Admitting: Cardiology

## 2023-12-24 DIAGNOSIS — E1169 Type 2 diabetes mellitus with other specified complication: Secondary | ICD-10-CM | POA: Diagnosis not present

## 2023-12-24 DIAGNOSIS — E7800 Pure hypercholesterolemia, unspecified: Secondary | ICD-10-CM | POA: Diagnosis not present

## 2023-12-24 LAB — COMPREHENSIVE METABOLIC PANEL WITH GFR
A1c: 6.9
EGFR: 60

## 2023-12-26 DIAGNOSIS — E7800 Pure hypercholesterolemia, unspecified: Secondary | ICD-10-CM | POA: Diagnosis not present

## 2023-12-26 DIAGNOSIS — Z6831 Body mass index (BMI) 31.0-31.9, adult: Secondary | ICD-10-CM | POA: Diagnosis not present

## 2023-12-26 DIAGNOSIS — I10 Essential (primary) hypertension: Secondary | ICD-10-CM | POA: Diagnosis not present

## 2023-12-26 DIAGNOSIS — Z1331 Encounter for screening for depression: Secondary | ICD-10-CM | POA: Diagnosis not present

## 2023-12-26 DIAGNOSIS — Z1339 Encounter for screening examination for other mental health and behavioral disorders: Secondary | ICD-10-CM | POA: Diagnosis not present

## 2023-12-26 DIAGNOSIS — Z0000 Encounter for general adult medical examination without abnormal findings: Secondary | ICD-10-CM | POA: Diagnosis not present

## 2023-12-26 DIAGNOSIS — E1169 Type 2 diabetes mellitus with other specified complication: Secondary | ICD-10-CM | POA: Diagnosis not present

## 2023-12-26 DIAGNOSIS — Z9181 History of falling: Secondary | ICD-10-CM | POA: Diagnosis not present

## 2023-12-26 DIAGNOSIS — I351 Nonrheumatic aortic (valve) insufficiency: Secondary | ICD-10-CM | POA: Diagnosis not present

## 2023-12-28 ENCOUNTER — Other Ambulatory Visit

## 2024-01-23 ENCOUNTER — Other Ambulatory Visit: Payer: Self-pay | Admitting: Cardiology

## 2024-01-25 ENCOUNTER — Ambulatory Visit: Attending: Cardiology

## 2024-01-25 DIAGNOSIS — I350 Nonrheumatic aortic (valve) stenosis: Secondary | ICD-10-CM

## 2024-01-25 DIAGNOSIS — I351 Nonrheumatic aortic (valve) insufficiency: Secondary | ICD-10-CM

## 2024-01-26 LAB — ECHOCARDIOGRAM COMPLETE
AR max vel: 1.36 cm2
AV Area VTI: 1.7 cm2
AV Area mean vel: 1.45 cm2
AV Mean grad: 18 mmHg
AV Peak grad: 33.9 mmHg
Ao pk vel: 2.91 m/s
Area-P 1/2: 3.03 cm2
P 1/2 time: 531 ms
S' Lateral: 3.6 cm

## 2024-02-01 ENCOUNTER — Ambulatory Visit: Admitting: Cardiology

## 2024-02-02 ENCOUNTER — Ambulatory Visit: Payer: Self-pay | Admitting: Cardiology

## 2024-02-06 ENCOUNTER — Ambulatory Visit: Attending: Cardiology | Admitting: Cardiology

## 2024-02-06 ENCOUNTER — Encounter: Payer: Self-pay | Admitting: Cardiology

## 2024-02-06 VITALS — BP 140/64 | HR 73 | Ht 73.0 in | Wt 247.2 lb

## 2024-02-06 DIAGNOSIS — R0609 Other forms of dyspnea: Secondary | ICD-10-CM | POA: Insufficient documentation

## 2024-02-06 DIAGNOSIS — E1169 Type 2 diabetes mellitus with other specified complication: Secondary | ICD-10-CM | POA: Diagnosis not present

## 2024-02-06 DIAGNOSIS — I350 Nonrheumatic aortic (valve) stenosis: Secondary | ICD-10-CM

## 2024-02-06 DIAGNOSIS — E7800 Pure hypercholesterolemia, unspecified: Secondary | ICD-10-CM

## 2024-02-06 DIAGNOSIS — E66811 Obesity, class 1: Secondary | ICD-10-CM | POA: Insufficient documentation

## 2024-02-06 DIAGNOSIS — I2510 Atherosclerotic heart disease of native coronary artery without angina pectoris: Secondary | ICD-10-CM

## 2024-02-06 HISTORY — DX: Nonrheumatic aortic (valve) stenosis: I35.0

## 2024-02-06 HISTORY — DX: Atherosclerotic heart disease of native coronary artery without angina pectoris: I25.10

## 2024-02-06 HISTORY — DX: Obesity, class 1: E66.811

## 2024-02-06 HISTORY — DX: Other forms of dyspnea: R06.09

## 2024-02-06 MED ORDER — ROSUVASTATIN CALCIUM 10 MG PO TABS: 10.0000 mg | ORAL_TABLET | Freq: Every day | ORAL | Status: DC

## 2024-02-06 MED ORDER — METOPROLOL TARTRATE 100 MG PO TABS: 100.0000 mg | ORAL_TABLET | Freq: Once | ORAL | 0 refills | Status: DC

## 2024-02-06 NOTE — Patient Instructions (Signed)
 Medication Instructions:  Your physician has recommended you make the following change in your medication:   Increase your Rosuvastatin  to 10 mg daily.  *If you need a refill on your cardiac medications before your next appointment, please call your pharmacy*   Lab Work: Your physician recommends that you return for lab work in: 1 month for fasting lipids and lft's. You need to have labs done when you are fasting.  You can come Monday through Friday 8:30 am to 12:00 pm and 1:15 to 4:30. You do not need to make an appointment as the order has already been placed.   If you have labs (blood work) drawn today and your tests are completely normal, you will receive your results only by: MyChart Message (if you have MyChart) OR A paper copy in the mail If you have any lab test that is abnormal or we need to change your treatment, we will call you to review the results.   Testing/Procedures:   Your cardiac CT will be scheduled at one of the below locations:    [facility] [address] [place], [place]  Please follow these instructions carefully (unless otherwise directed):  An IV will be required for this test and Nitroglycerin  will be given.  Hold all erectile dysfunction medications at least 3 days (72 hrs) prior to test. (Ie viagra, cialis, sildenafil, tadalafil, etc)   On the Night Before the Test: Be sure to Drink plenty of water. Do not consume any caffeinated/decaffeinated beverages or chocolate 12 hours prior to your test. Do not take any antihistamines 12 hours prior to your test.  On the Day of the Test: Drink plenty of water until 1 hour prior to the test. Do not eat any food 1 hour prior to test. You may take your regular medications prior to the test.  Take metoprolol  (Lopressor ) 100 mg two hours prior to test.      After the Test: Drink plenty of water. After receiving IV contrast, you may experience a mild flushed feeling. This is normal. On occasion, you  may experience a mild rash up to 24 hours after the test. This is not dangerous. If this occurs, you can take Benadryl 25 mg, Zyrtec, Claritin, or Allegra and increase your fluid intake. (Patients taking Tikosyn should avoid Benadryl, and may take Zyrtec, Claritin, or Allegra) If you experience trouble breathing, this can be serious. If it is severe call 911 IMMEDIATELY. If it is mild, please call our office.  We will call to schedule your test 2-4 weeks out understanding that some insurance companies will need an authorization prior to the service being performed.   For more information and frequently asked questions, please visit our website : [email or web address]  For non-scheduling related questions, please contact the cardiac imaging nurse navigator should you have any questions/concerns: Cardiac Imaging Nurse Navigators Direct Office Dial: [phone]   For scheduling needs, including cancellations and rescheduling, please call [place], [phone].   Follow-Up: At [facility], you and your health needs are our priority.  As part of our continuing mission to provide you with exceptional heart care, we have created designated Provider Care Teams.  These Care Teams include your primary Cardiologist (physician) and Advanced Practice Providers (APPs -  Physician Assistants and Nurse Practitioners) who all work together to provide you with the care you need, when you need it.  We recommend signing up for the patient portal called MyChart.  Sign up information is provided on this After Visit Summary.  MyChart is used to connect with patients for Virtual Visits (Telemedicine).  Patients are able to view lab/test results, encounter notes, upcoming appointments, etc.  Non-urgent messages can be sent to your provider as well.   To learn more about what you can do with MyChart, go to [email or web address].    Your next appointment:   2 month(s)  The format for your  next appointment:   In Person  Provider:   Jennifer Crape, MD    Other Instructions none  Important Information About Sugar

## 2024-02-06 NOTE — Progress Notes (Signed)
 Cardiology Office Note:    Date:  02/06/2024   ID:  Patrick Mcdaniel, DOB 1946-04-13, MRN [id number]  PCP:  Fernand Tracey DELENA, MD  Cardiologist:  Jennifer JONELLE Crape, MD   Referring MD: Fernand Tracey DELENA, MD    ASSESSMENT:    1. Elevated cholesterol   2. Mild coronary artery disease   3. Type 2 diabetes mellitus with hypercholesterolemia (HCC)   4. Obesity (BMI 30.0-34.9)   5. Moderate aortic stenosis   6. Dyspnea on exertion    PLAN:    In order of problems listed above:  Dyspnea on exertion: Coronary artery disease: Secondary prevention stressed with the patient.  Importance of compliance with diet medication stressed and patient verbalized standing.  His symptoms are concerning and therefore I discussed CT coronary angiography for FFR especially in view of multiple risk factors.  I want to rule out an ischemic substrate.  He is agreeable.  Further recommendations will depend on the findings of this test. Mild to moderate aortic stenosis: Medical management at this time.  Will we will continue to monitor. Essential hypertension: Blood pressure is stable and diet was emphasized.  Lifestyle modification urged. Diabetes mellitus and mixed dyslipidemia: Not under optimal control.  A1c is markedly elevated and I cautioned him about this.  Also lipids are not at goal I will increase his statin to 10 mg daily of rosuvastatin  will be back in 1 month for liver lipid check.  Diet emphasized.  Weight reduction stressed risks of obesity explained and he promises to do better. Patient will be seen in follow-up appointment in 2 months or earlier if the patient has any concerns.    Medication Adjustments/Labs and Tests Ordered: Current medicines are reviewed at length with the patient today.  Concerns regarding medicines are outlined above.  Orders Placed This Encounter  Procedures   EKG 12-Lead   No orders of the defined types were placed in this encounter.    No chief complaint on file.    History  of Present Illness:    Patrick Mcdaniel is a 78 y.o. male.  Patient has past medical history of mild coronary artery disease, essential hypertension, mixed dyslipidemia, diabetes mellitus and mild to moderate aortic stenosis.  He mentions to me that he is gradually noticing dyspnea on exertion.  His dyspnea is getting worse in the past couple of weeks.  He takes a low-dose statin.  He has significant hyperlipidemia.  He seeks evaluation for this.  At the time of my evaluation, the patient is alert awake oriented and in no distress.  Past Medical History:  Diagnosis Date   Benign non-nodular prostatic hyperplasia with lower urinary tract symptoms    Calculus of gallbladder with chronic cholecystitis without obstruction 02/09/2021   Elevated cholesterol    Major depressive disorder with single episode, remission status unspecified    Postoperative examination 02/09/2021   Type 2 diabetes mellitus with hypercholesterolemia (HCC)     Past Surgical History:  Procedure Laterality Date   CHOLECYSTECTOMY     POLYPECTOMY  1981   Urethral polyps by Dr. Sallyanne HORDE    Current Medications: Current Meds  Medication Sig   amoxicillin  (AMOXIL ) 500 MG tablet Take 4 Tablets 45 minutes prior to dental exam   Aspirin Effervescent (ALKA-SELTZER PO) Take 1 tablet by mouth daily.   metFORMIN (GLUCOPHAGE-XR) 750 MG 24 hr tablet Take 750 mg by mouth every morning.   olmesartan -hydrochlorothiazide (BENICAR  HCT) 40-12.5 MG tablet Take 1 tablet by mouth daily.  rosuvastatin  (CRESTOR ) 5 MG tablet Take 5 mg by mouth daily.   tamsulosin (FLOMAX) 0.4 MG CAPS capsule Take 0.4 mg by mouth daily.     Allergies:   Patient has no known allergies.   Social History   Socioeconomic History   Marital status: Married    Spouse name: Not on file   Number of children: Not on file   Years of education: Not on file   Highest education level: Not on file  Occupational History   Not on file  Tobacco Use   Smoking  status: Never    Passive exposure: Never   Smokeless tobacco: Never  Vaping Use   Vaping status: Never Used  Substance and Sexual Activity   Alcohol use: Yes    Alcohol/week: 7.0 - 14.0 standard drinks of alcohol    Types: 7 - 14 Cans of beer per week   Drug use: Never   Sexual activity: Not on file  Other Topics Concern   Not on file  Social History Narrative   Not on file   Social Drivers of Health   Financial Resource Strain: Not on file  Food Insecurity: Not on file  Transportation Needs: Not on file  Physical Activity: Not on file  Stress: Not on file  Social Connections: Unknown (11/15/2021)   Received from [facility]   Social Network    Social Network: Not on file     Family History: The patient's family history includes COPD in his father; Heart attack in his father; Hypercholesterolemia in his father; Hypertension in his father and mother.  ROS:   Please see the history of present illness.    All other systems reviewed and are negative.  EKGs/Labs/Other Studies Reviewed:    The following studies were reviewed today: .SABRAEKG Interpretation Date/Time:  Wednesday February 06 2024 14:08:09 EDT Ventricular Rate:  73 PR Interval:  166 QRS Duration:  92 QT Interval:  384 QTC Calculation: 423 R Axis:   2  Text Interpretation: Normal sinus rhythm Inferior infarct , age undetermined Anterior infarct , age undetermined Abnormal ECG No previous ECGs available Confirmed by Edwyna Backers [phone]) on 02/06/2024 2:22:47 PM     Recent Labs: No results found for requested labs within last 365 days.  Recent Lipid Panel No results found for: CHOL, TRIG, HDL, CHOLHDL, VLDL, LDLCALC, LDLDIRECT  Physical Exam:    VS:  BP (!) 140/64   Pulse 73   Ht 6' 1 (1.854 m)   Wt 247 lb 3.2 oz (112.1 kg)   SpO2 94%   BMI 32.61 kg/m     Wt Readings from Last 3 Encounters:  02/06/24 247 lb 3.2 oz (112.1 kg)  11/01/22 236 lb (107 kg)  09/08/21 238 lb (108 kg)      GEN: Patient is in no acute distress HEENT: Normal NECK: No JVD; No carotid bruits LYMPHATICS: No lymphadenopathy CARDIAC: Hear sounds regular, 2/6 systolic murmur at the apex. RESPIRATORY:  Clear to auscultation without rales, wheezing or rhonchi  ABDOMEN: Soft, non-tender, non-distended MUSCULOSKELETAL:  No edema; No deformity  SKIN: Warm and dry NEUROLOGIC:  Alert and oriented x 3 PSYCHIATRIC:  Normal affect   Signed, Backers JONELLE Edwyna, MD  02/06/2024 2:41 PM    [facility]

## 2024-02-12 ENCOUNTER — Other Ambulatory Visit: Payer: Self-pay | Admitting: Cardiology

## 2024-02-12 DIAGNOSIS — I2510 Atherosclerotic heart disease of native coronary artery without angina pectoris: Secondary | ICD-10-CM

## 2024-02-21 ENCOUNTER — Other Ambulatory Visit: Payer: Self-pay | Admitting: Cardiology

## 2024-03-10 ENCOUNTER — Other Ambulatory Visit: Payer: Self-pay

## 2024-03-10 ENCOUNTER — Telehealth (HOSPITAL_COMMUNITY): Payer: Self-pay | Admitting: Emergency Medicine

## 2024-03-10 DIAGNOSIS — E7800 Pure hypercholesterolemia, unspecified: Secondary | ICD-10-CM

## 2024-03-10 DIAGNOSIS — I2510 Atherosclerotic heart disease of native coronary artery without angina pectoris: Secondary | ICD-10-CM

## 2024-03-10 MED ORDER — ROSUVASTATIN CALCIUM 10 MG PO TABS: 10.0000 mg | ORAL_TABLET | Freq: Every day | ORAL | 3 refills | Status: DC

## 2024-03-10 NOTE — Telephone Encounter (Signed)
 Reaching out to patient to offer assistance regarding upcoming cardiac imaging study; pt verbalizes understanding of appt date/time, parking situation and where to check in, pre-test NPO status and medications ordered, and verified current allergies; name and call back number provided for further questions should they arise [place] RN Navigator Cardiac Imaging [facility] [phone] office [phone] cell

## 2024-03-11 ENCOUNTER — Ambulatory Visit (INDEPENDENT_AMBULATORY_CARE_PROVIDER_SITE_OTHER)
Admission: RE | Admit: 2024-03-11 | Discharge: 2024-03-11 | Disposition: A | Discharge status: Home / Self Care | Source: Ambulatory Visit | Attending: Cardiology | Admitting: Cardiology

## 2024-03-11 DIAGNOSIS — I2510 Atherosclerotic heart disease of native coronary artery without angina pectoris: Secondary | ICD-10-CM | POA: Diagnosis not present

## 2024-03-11 DIAGNOSIS — R0609 Other forms of dyspnea: Secondary | ICD-10-CM

## 2024-03-11 LAB — I-STAT CREATININE (MANUAL ENTRY): Creatinine, Ser: 1.2 — AB (ref 0.50–1.10)

## 2024-03-11 MED ORDER — DILTIAZEM HCL 25 MG/5ML IV SOLN: 10.0000 mg | INTRAVENOUS | Status: DC | PRN

## 2024-03-11 MED ORDER — IOHEXOL 350 MG/ML SOLN
100.0000 mL | Freq: Once | INTRAVENOUS | Status: AC | PRN
  Administered 2024-03-11: 100 mL via INTRAVENOUS

## 2024-03-11 MED ORDER — NITROGLYCERIN 0.4 MG SL SUBL
0.8000 mg | SUBLINGUAL_TABLET | Freq: Once | SUBLINGUAL | Status: AC
  Administered 2024-03-11: 0.8 mg via SUBLINGUAL

## 2024-03-11 MED ORDER — METOPROLOL TARTRATE 5 MG/5ML IV SOLN: 10.0000 mg | Freq: Once | INTRAVENOUS | Status: DC | PRN

## 2024-03-13 ENCOUNTER — Other Ambulatory Visit (HOSPITAL_BASED_OUTPATIENT_CLINIC_OR_DEPARTMENT_OTHER): Admitting: Radiology

## 2024-04-07 ENCOUNTER — Ambulatory Visit: Payer: Self-pay | Admitting: Cardiology

## 2024-04-07 DIAGNOSIS — I2510 Atherosclerotic heart disease of native coronary artery without angina pectoris: Secondary | ICD-10-CM

## 2024-04-11 DIAGNOSIS — E785 Hyperlipidemia, unspecified: Secondary | ICD-10-CM | POA: Diagnosis not present

## 2024-04-11 DIAGNOSIS — R011 Cardiac murmur, unspecified: Secondary | ICD-10-CM | POA: Diagnosis not present

## 2024-04-11 DIAGNOSIS — E119 Type 2 diabetes mellitus without complications: Secondary | ICD-10-CM | POA: Diagnosis not present

## 2024-04-11 DIAGNOSIS — Z8249 Family history of ischemic heart disease and other diseases of the circulatory system: Secondary | ICD-10-CM | POA: Diagnosis not present

## 2024-04-11 DIAGNOSIS — Z7984 Long term (current) use of oral hypoglycemic drugs: Secondary | ICD-10-CM | POA: Diagnosis not present

## 2024-04-11 DIAGNOSIS — E669 Obesity, unspecified: Secondary | ICD-10-CM | POA: Diagnosis not present

## 2024-04-11 DIAGNOSIS — I10 Essential (primary) hypertension: Secondary | ICD-10-CM | POA: Diagnosis not present

## 2024-04-11 DIAGNOSIS — K5900 Constipation, unspecified: Secondary | ICD-10-CM | POA: Diagnosis not present

## 2024-04-11 DIAGNOSIS — N400 Enlarged prostate without lower urinary tract symptoms: Secondary | ICD-10-CM | POA: Diagnosis not present

## 2024-04-15 NOTE — Telephone Encounter (Signed)
 Results reviewed with pt as per Dr. Posey note.  Pt verbalized understanding and had no additional questions. Routed to PCP.  Orders placed

## 2024-04-22 DIAGNOSIS — I2510 Atherosclerotic heart disease of native coronary artery without angina pectoris: Secondary | ICD-10-CM | POA: Diagnosis not present

## 2024-04-23 LAB — LIPID PANEL
Chol/HDL Ratio: 3.2 ratio (ref 0.0–5.0)
Cholesterol, Total: 158 mg/dL (ref 100–199)
HDL: 50 mg/dL (ref >39)
LDL Chol Calc (NIH): 85 mg/dL (ref 0–99)
Triglycerides: 132 mg/dL (ref 0–149)
VLDL Cholesterol Cal: 23 mg/dL (ref 5–40)

## 2024-04-23 LAB — COMPREHENSIVE METABOLIC PANEL WITH GFR
ALT: 28 IU/L (ref 0–44)
AST: 25 IU/L (ref 0–40)
Albumin: 4.2 g/dL (ref 3.8–4.8)
Alkaline Phosphatase: 104 IU/L (ref 47–123)
BUN/Creatinine Ratio: 11 (ref 10–24)
BUN: 12 mg/dL (ref 8–27)
Bilirubin Total: 1 mg/dL (ref 0.0–1.2)
CO2: 22 mmol/L (ref 20–29)
Calcium: 9.6 mg/dL (ref 8.6–10.2)
Chloride: 101 mmol/L (ref 96–106)
Creatinine, Ser: 1.05 mg/dL (ref 0.76–1.27)
Globulin, Total: 2.5 g/dL (ref 1.5–4.5)
Glucose: 144 mg/dL — ABNORMAL HIGH (ref 70–99)
Potassium: 4.2 mmol/L (ref 3.5–5.2)
Sodium: 139 mmol/L (ref 134–144)
Total Protein: 6.7 g/dL (ref 6.0–8.5)
eGFR: 73 mL/min/1.73 (ref >59)

## 2024-04-25 ENCOUNTER — Other Ambulatory Visit: Payer: Self-pay

## 2024-04-29 ENCOUNTER — Ambulatory Visit: Attending: Cardiology | Admitting: Cardiology

## 2024-04-29 ENCOUNTER — Encounter: Payer: Self-pay | Admitting: Cardiology

## 2024-04-29 VITALS — BP 160/70 | HR 66 | Ht 73.0 in | Wt 247.2 lb

## 2024-04-29 DIAGNOSIS — E7800 Pure hypercholesterolemia, unspecified: Secondary | ICD-10-CM

## 2024-04-29 DIAGNOSIS — I350 Nonrheumatic aortic (valve) stenosis: Secondary | ICD-10-CM | POA: Diagnosis not present

## 2024-04-29 DIAGNOSIS — E1169 Type 2 diabetes mellitus with other specified complication: Secondary | ICD-10-CM

## 2024-04-29 DIAGNOSIS — E66811 Obesity, class 1: Secondary | ICD-10-CM

## 2024-04-29 DIAGNOSIS — I2510 Atherosclerotic heart disease of native coronary artery without angina pectoris: Secondary | ICD-10-CM | POA: Diagnosis not present

## 2024-04-29 MED ORDER — ROSUVASTATIN CALCIUM 20 MG PO TABS: 20.0000 mg | ORAL_TABLET | Freq: Every day | ORAL | 3 refills | Status: AC

## 2024-04-29 NOTE — Patient Instructions (Addendum)
 Medication Instructions:  Your physician has recommended you make the following change in your medication:   Increase your Rosuvastatin  Calcium  to 20 mg daily.    *If you need a refill on your cardiac medications before your next appointment, please call your pharmacy*   Lab Work: Return in 6 weeks for CMET & lipids to be drawn Make sure you are fasting the day of your blood work.  If you have labs (blood work) drawn today and your tests are completely normal, you will receive your results only by: MyChart Message (if you have MyChart) OR A paper copy in the mail If you have any lab test that is abnormal or we need to change your treatment, we will call you to review the results.   Testing/Procedures: None ordered   Follow-Up: At [facility], you and your health needs are our priority.  As part of our continuing mission to provide you with exceptional heart care, we have created designated Provider Care Teams.  These Care Teams include your primary Cardiologist (physician) and Advanced Practice Providers (APPs -  Physician Assistants and Nurse Practitioners) who all work together to provide you with the care you need, when you need it.  We recommend signing up for the patient portal called MyChart.  Sign up information is provided on this After Visit Summary.  MyChart is used to connect with patients for Virtual Visits (Telemedicine).  Patients are able to view lab/test results, encounter notes, upcoming appointments, etc.  Non-urgent messages can be sent to your provider as well.   To learn more about what you can do with MyChart, go to [email or web address].    Your next appointment:   9 month(s)  The format for your next appointment:   In Person  Provider:   Jennifer Crape, MD    Other Instructions none  Important Information About Sugar

## 2024-04-29 NOTE — Progress Notes (Signed)
 Cardiology Office Note:    Date:  04/29/2024   ID:  SATCHEL HEIDINGER, DOB 10-01-1945, MRN [id number]  PCP:  Fernand Tracey DELENA, MD  Cardiologist:  Jennifer JONELLE Crape, MD   Referring MD: Fernand Tracey DELENA, MD    ASSESSMENT:    1. Moderate aortic stenosis   2. Mild coronary artery disease   3. Type 2 diabetes mellitus with hypercholesterolemia (HCC)   4. Obesity (BMI 30.0-34.9)   5. Elevated cholesterol    PLAN:    In order of problems listed above:  Coronary artery disease: Secondary prevention stressed with the patient.  Importance of compliance with diet medication stressed and patient verbalized standing.  He was advised to walk half an hour on a daily basis. Mild to moderate aortic stenosis: Stable at this time.  I discussed with him the findings and symptoms education was given to the patient Mixed dyslipidemia: On lipid-lowering medications followed by primary care.  Lipids reviewed and found to be at goal. Diabetes mellitus and obesity: Weight reduction stressed diet emphasized and he promises to do better. Essential hypertension: Pressures are elevated.  Diet emphasized.  Salt intake issues were discussed.  He is going to keep a track of his blood pressures at home and get back to us  in 1 to 2 weeks and we will act accordingly.  Lifestyle modification was encouraged salt intake and diet issues were discussed. Patient will be seen in follow-up appointment in 6 months or earlier if the patient has any concerns.    Medication Adjustments/Labs and Tests Ordered: Current medicines are reviewed at length with the patient today.  Concerns regarding medicines are outlined above.  No orders of the defined types were placed in this encounter.  No orders of the defined types were placed in this encounter.    No chief complaint on file.    History of Present Illness:    Patrick Mcdaniel is a 78 y.o. male.  Patient has past medical history of coronary artery disease mild in nature, mild to  moderate aortic stenosis, essential hypertension, mixed dyslipidemia and diabetes mellitus.  He leads a sedentary lifestyle.  He denies any chest pain orthopnea or PND.  At the time of my evaluation, the patient is alert awake oriented and in no distress.  Past Medical History:  Diagnosis Date   Benign non-nodular prostatic hyperplasia with lower urinary tract symptoms    Calculus of gallbladder with chronic cholecystitis without obstruction 02/09/2021   Dyspnea on exertion 02/06/2024   Elevated cholesterol    Major depressive disorder with single episode, remission status unspecified    Mild coronary artery disease 02/06/2024   Moderate aortic stenosis 02/06/2024   Obesity (BMI 30.0-34.9) 02/06/2024   Postoperative examination 02/09/2021   Type 2 diabetes mellitus with hypercholesterolemia (HCC)     Past Surgical History:  Procedure Laterality Date   CHOLECYSTECTOMY     POLYPECTOMY  1981   Urethral polyps by Dr. Sallyanne HORDE    Current Medications: Current Meds  Medication Sig   amoxicillin  (AMOXIL ) 500 MG tablet Take 4 Tablets 45 minutes prior to dental exam   Aspirin Effervescent (ALKA-SELTZER PO) Take 1 tablet by mouth daily.   metFORMIN (GLUCOPHAGE-XR) 750 MG 24 hr tablet Take 750 mg by mouth every morning.   olmesartan -hydrochlorothiazide (BENICAR  HCT) 40-12.5 MG tablet TAKE 1 TABLET EVERY DAY   rosuvastatin  (CRESTOR ) 10 MG tablet Take 1 tablet (10 mg total) by mouth daily.   tamsulosin (FLOMAX) 0.4 MG CAPS capsule Take 0.4  mg by mouth daily.     Allergies:   Patient has no known allergies.   Social History   Socioeconomic History   Marital status: Married    Spouse name: Not on file   Number of children: Not on file   Years of education: Not on file   Highest education level: Not on file  Occupational History   Not on file  Tobacco Use   Smoking status: Never    Passive exposure: Never   Smokeless tobacco: Never  Vaping Use   Vaping status: Never Used   Substance and Sexual Activity   Alcohol use: Yes    Alcohol/week: 7.0 - 14.0 standard drinks of alcohol    Types: 7 - 14 Cans of beer per week   Drug use: Never   Sexual activity: Not on file  Other Topics Concern   Not on file  Social History Narrative   Not on file   Social Drivers of Health   Financial Resource Strain: Not on file  Food Insecurity: Not on file  Transportation Needs: Not on file  Physical Activity: Not on file  Stress: Not on file  Social Connections: Unknown (11/15/2021)   Received from [facility]   Social Network    Social Network: Not on file     Family History: The patient's family history includes COPD in his father; Heart attack in his father; Hypercholesterolemia in his father; Hypertension in his father and mother.  ROS:   Please see the history of present illness.    All other systems reviewed and are negative.  EKGs/Labs/Other Studies Reviewed:    The following studies were reviewed today: .SABRA   I discussed findings of CT coronary angiography and echo with the patient at length   Recent Labs: 04/22/2024: ALT 28; BUN 12; Creatinine, Ser 1.05; Potassium 4.2; Sodium 139  Recent Lipid Panel    Component Value Date/Time   CHOL 158 04/22/2024 1057   TRIG 132 04/22/2024 1057   HDL 50 04/22/2024 1057   CHOLHDL 3.2 04/22/2024 1057   LDLCALC 85 04/22/2024 1057    Physical Exam:    VS:  BP (!) 162/60   Pulse 66   Ht 6' 1 (1.854 m)   Wt 247 lb 3.2 oz (112.1 kg)   SpO2 99%   BMI 32.61 kg/m     Wt Readings from Last 3 Encounters:  04/29/24 247 lb 3.2 oz (112.1 kg)  02/06/24 247 lb 3.2 oz (112.1 kg)  11/01/22 236 lb (107 kg)     GEN: Patient is in no acute distress HEENT: Normal NECK: No JVD; No carotid bruits LYMPHATICS: No lymphadenopathy CARDIAC: Hear sounds regular, 2/6 systolic murmur at the apex. RESPIRATORY:  Clear to auscultation without rales, wheezing or rhonchi  ABDOMEN: Soft, non-tender,  non-distended MUSCULOSKELETAL:  No edema; No deformity  SKIN: Warm and dry NEUROLOGIC:  Alert and oriented x 3 PSYCHIATRIC:  Normal affect   Signed, Jennifer JONELLE Crape, MD  04/29/2024 3:52 PM    [facility]

## 2024-06-02 DIAGNOSIS — E7800 Pure hypercholesterolemia, unspecified: Secondary | ICD-10-CM | POA: Diagnosis not present

## 2024-06-02 DIAGNOSIS — E1169 Type 2 diabetes mellitus with other specified complication: Secondary | ICD-10-CM | POA: Diagnosis not present

## 2024-06-03 DIAGNOSIS — E1169 Type 2 diabetes mellitus with other specified complication: Secondary | ICD-10-CM | POA: Diagnosis not present

## 2024-06-03 DIAGNOSIS — E7800 Pure hypercholesterolemia, unspecified: Secondary | ICD-10-CM | POA: Diagnosis not present

## 2024-06-03 DIAGNOSIS — I351 Nonrheumatic aortic (valve) insufficiency: Secondary | ICD-10-CM | POA: Diagnosis not present

## 2024-06-03 DIAGNOSIS — Z6831 Body mass index (BMI) 31.0-31.9, adult: Secondary | ICD-10-CM | POA: Diagnosis not present

## 2024-06-03 DIAGNOSIS — I10 Essential (primary) hypertension: Secondary | ICD-10-CM | POA: Diagnosis not present

## 2024-08-01 NOTE — Progress Notes (Signed)
 Patrick Mcdaniel                                          MRN: [id number]   08/01/2024   The [facility] Quality Team Specialist reviewed this patient medical record for the purposes of chart review for care gap closure. The following were reviewed: chart review for care gap closure-controlling blood pressure.    [facility] Quality Team
